# Patient Record
Sex: Male | Born: 1935 | Race: White | Hispanic: No | Marital: Married | Smoking: Former smoker
Health system: Southern US, Community
[De-identification: ages and names within clinical notes are randomized; demographics above are authoritative.]

## PROBLEM LIST (undated history)

## (undated) DIAGNOSIS — C439 Malignant melanoma of skin, unspecified: Secondary | ICD-10-CM

## (undated) DIAGNOSIS — E13311 Other specified diabetes mellitus with unspecified diabetic retinopathy with macular edema: Secondary | ICD-10-CM

## (undated) DIAGNOSIS — Z9289 Personal history of other medical treatment: Secondary | ICD-10-CM

## (undated) DIAGNOSIS — R42 Dizziness and giddiness: Secondary | ICD-10-CM

## (undated) DIAGNOSIS — I639 Cerebral infarction, unspecified: Secondary | ICD-10-CM

## (undated) DIAGNOSIS — C4491 Basal cell carcinoma of skin, unspecified: Secondary | ICD-10-CM

## (undated) DIAGNOSIS — N289 Disorder of kidney and ureter, unspecified: Secondary | ICD-10-CM

## (undated) DIAGNOSIS — R51 Headache: Secondary | ICD-10-CM

## (undated) DIAGNOSIS — L910 Hypertrophic scar: Secondary | ICD-10-CM

## (undated) DIAGNOSIS — E119 Type 2 diabetes mellitus without complications: Secondary | ICD-10-CM

## (undated) DIAGNOSIS — M1990 Unspecified osteoarthritis, unspecified site: Secondary | ICD-10-CM

## (undated) DIAGNOSIS — C44721 Squamous cell carcinoma of skin of unspecified lower limb, including hip: Secondary | ICD-10-CM

## (undated) DIAGNOSIS — R269 Unspecified abnormalities of gait and mobility: Secondary | ICD-10-CM

## (undated) DIAGNOSIS — N400 Enlarged prostate without lower urinary tract symptoms: Secondary | ICD-10-CM

## (undated) DIAGNOSIS — D136 Benign neoplasm of pancreas: Secondary | ICD-10-CM

## (undated) DIAGNOSIS — L989 Disorder of the skin and subcutaneous tissue, unspecified: Secondary | ICD-10-CM

## (undated) DIAGNOSIS — H9190 Unspecified hearing loss, unspecified ear: Secondary | ICD-10-CM

## (undated) DIAGNOSIS — Z9621 Cochlear implant status: Secondary | ICD-10-CM

## (undated) DIAGNOSIS — I2699 Other pulmonary embolism without acute cor pulmonale: Secondary | ICD-10-CM

## (undated) HISTORY — DX: Dizziness and giddiness: R42

## (undated) HISTORY — DX: Benign neoplasm of pancreas: D13.6

## (undated) HISTORY — PX: CATARACT EXTRACTION: SUR2

## (undated) HISTORY — DX: Benign prostatic hyperplasia without lower urinary tract symptoms: N40.0

## (undated) HISTORY — DX: Cerebral infarction, unspecified: I63.9

## (undated) HISTORY — DX: Unspecified hearing loss, unspecified ear: H91.90

## (undated) HISTORY — DX: Unspecified abnormalities of gait and mobility: R26.9

## (undated) HISTORY — DX: Basal cell carcinoma of skin, unspecified: C44.91

## (undated) HISTORY — PX: COCHLEAR IMPLANT: SUR684

## (undated) HISTORY — DX: Cochlear implant status: Z96.21

## (undated) HISTORY — DX: Disorder of kidney and ureter, unspecified: N28.9

## (undated) HISTORY — DX: Other specified diabetes mellitus with unspecified diabetic retinopathy with macular edema: E13.311

## (undated) HISTORY — DX: Malignant melanoma of skin, unspecified: C43.9

## (undated) HISTORY — DX: Headache: R51

## (undated) HISTORY — DX: Hypertrophic scar: L91.0

## (undated) HISTORY — PX: MELANOMA EXCISION: SHX5266

---

## 1999-08-28 DIAGNOSIS — I2699 Other pulmonary embolism without acute cor pulmonale: Secondary | ICD-10-CM

## 1999-08-28 DIAGNOSIS — Z9289 Personal history of other medical treatment: Secondary | ICD-10-CM

## 1999-08-28 HISTORY — DX: Other pulmonary embolism without acute cor pulmonale: I26.99

## 1999-08-28 HISTORY — DX: Personal history of other medical treatment: Z92.89

## 1999-08-28 HISTORY — PX: COLECTOMY: SHX59

## 1999-08-28 HISTORY — PX: ABDOMINAL SURGERY: SHX537

## 1999-08-28 HISTORY — PX: SPLENECTOMY, TOTAL: SHX788

## 1999-08-28 HISTORY — PX: PANCREATECTOMY: SHX1019

## 2001-09-19 ENCOUNTER — Encounter: Payer: Self-pay | Admitting: Otolaryngology

## 2001-09-19 ENCOUNTER — Encounter: Admission: RE | Admit: 2001-09-19 | Discharge: 2001-09-19 | Payer: Self-pay | Admitting: Otolaryngology

## 2001-10-14 ENCOUNTER — Encounter: Admission: RE | Admit: 2001-10-14 | Discharge: 2001-10-14 | Payer: Self-pay | Admitting: Otolaryngology

## 2001-10-14 ENCOUNTER — Encounter: Payer: Self-pay | Admitting: Otolaryngology

## 2001-10-15 ENCOUNTER — Ambulatory Visit (HOSPITAL_BASED_OUTPATIENT_CLINIC_OR_DEPARTMENT_OTHER): Admission: RE | Admit: 2001-10-15 | Discharge: 2001-10-16 | Payer: Self-pay | Admitting: Otolaryngology

## 2001-10-15 ENCOUNTER — Encounter (INDEPENDENT_AMBULATORY_CARE_PROVIDER_SITE_OTHER): Payer: Self-pay | Admitting: *Deleted

## 2010-10-10 DIAGNOSIS — H2510 Age-related nuclear cataract, unspecified eye: Secondary | ICD-10-CM | POA: Insufficient documentation

## 2011-06-08 ENCOUNTER — Ambulatory Visit (INDEPENDENT_AMBULATORY_CARE_PROVIDER_SITE_OTHER): Payer: Medicare Other | Admitting: Urology

## 2011-06-08 DIAGNOSIS — N3941 Urge incontinence: Secondary | ICD-10-CM

## 2011-06-08 DIAGNOSIS — R3129 Other microscopic hematuria: Secondary | ICD-10-CM

## 2011-06-08 DIAGNOSIS — N401 Enlarged prostate with lower urinary tract symptoms: Secondary | ICD-10-CM

## 2011-06-08 DIAGNOSIS — R972 Elevated prostate specific antigen [PSA]: Secondary | ICD-10-CM

## 2011-06-18 DIAGNOSIS — G459 Transient cerebral ischemic attack, unspecified: Secondary | ICD-10-CM

## 2011-10-29 DIAGNOSIS — Z9621 Cochlear implant status: Secondary | ICD-10-CM | POA: Insufficient documentation

## 2012-04-23 DIAGNOSIS — R269 Unspecified abnormalities of gait and mobility: Secondary | ICD-10-CM | POA: Insufficient documentation

## 2012-04-23 DIAGNOSIS — H532 Diplopia: Secondary | ICD-10-CM | POA: Insufficient documentation

## 2012-04-23 DIAGNOSIS — R42 Dizziness and giddiness: Secondary | ICD-10-CM | POA: Insufficient documentation

## 2012-04-23 DIAGNOSIS — I679 Cerebrovascular disease, unspecified: Secondary | ICD-10-CM | POA: Insufficient documentation

## 2012-05-02 ENCOUNTER — Other Ambulatory Visit: Payer: Self-pay | Admitting: Neurology

## 2012-05-02 DIAGNOSIS — R269 Unspecified abnormalities of gait and mobility: Secondary | ICD-10-CM

## 2012-05-02 DIAGNOSIS — R42 Dizziness and giddiness: Secondary | ICD-10-CM

## 2012-05-02 DIAGNOSIS — I679 Cerebrovascular disease, unspecified: Secondary | ICD-10-CM

## 2012-05-02 DIAGNOSIS — H532 Diplopia: Secondary | ICD-10-CM

## 2012-05-06 ENCOUNTER — Ambulatory Visit
Admission: RE | Admit: 2012-05-06 | Discharge: 2012-05-06 | Disposition: A | Discharge status: Home / Self Care | Payer: Medicare Other | Source: Ambulatory Visit | Attending: Neurology | Admitting: Neurology

## 2012-05-06 DIAGNOSIS — R42 Dizziness and giddiness: Secondary | ICD-10-CM

## 2012-05-06 DIAGNOSIS — I679 Cerebrovascular disease, unspecified: Secondary | ICD-10-CM

## 2012-05-06 DIAGNOSIS — H532 Diplopia: Secondary | ICD-10-CM

## 2012-05-06 DIAGNOSIS — R269 Unspecified abnormalities of gait and mobility: Secondary | ICD-10-CM

## 2012-06-20 ENCOUNTER — Ambulatory Visit (INDEPENDENT_AMBULATORY_CARE_PROVIDER_SITE_OTHER): Payer: Medicare Other | Admitting: Urology

## 2012-06-20 DIAGNOSIS — N401 Enlarged prostate with lower urinary tract symptoms: Secondary | ICD-10-CM

## 2012-06-20 DIAGNOSIS — R972 Elevated prostate specific antigen [PSA]: Secondary | ICD-10-CM

## 2012-06-20 DIAGNOSIS — R339 Retention of urine, unspecified: Secondary | ICD-10-CM

## 2012-11-28 ENCOUNTER — Ambulatory Visit (INDEPENDENT_AMBULATORY_CARE_PROVIDER_SITE_OTHER): Payer: Medicare Other | Admitting: Urology

## 2012-11-28 DIAGNOSIS — N401 Enlarged prostate with lower urinary tract symptoms: Secondary | ICD-10-CM

## 2012-11-28 DIAGNOSIS — R351 Nocturia: Secondary | ICD-10-CM

## 2012-11-28 DIAGNOSIS — R972 Elevated prostate specific antigen [PSA]: Secondary | ICD-10-CM

## 2012-12-15 ENCOUNTER — Encounter: Payer: Self-pay | Admitting: Neurology

## 2012-12-15 ENCOUNTER — Telehealth: Payer: Self-pay | Admitting: *Deleted

## 2012-12-15 NOTE — Telephone Encounter (Signed)
Message copied by Harlon Flor, Eldridge Marcott L on Mon Dec 15, 2012 11:04 AM ------      Message from: Blake George      Created: Mon Dec 15, 2012  9:23 AM       Pt needs appt with Dr. Lurline Hare in head x 2 weeks--has 2 implants in head--[phone](wife-Eloise) ------

## 2012-12-15 NOTE — Telephone Encounter (Signed)
Called patient and spoke with wife, stated that patient had follow up with Dr May(placed implants), he informed to follow up with Dr Anne Hahn, scheduled patient for appointment first available. Confirmed appt.with wife.

## 2012-12-18 ENCOUNTER — Telehealth: Payer: Self-pay | Admitting: *Deleted

## 2012-12-18 NOTE — Telephone Encounter (Signed)
I called and left a message for the patient to callback to the office to schedule an appt.

## 2012-12-18 NOTE — Telephone Encounter (Signed)
Message copied by Janey Greaser on Thu Dec 18, 2012  4:59 PM ------      Message from: Harlon Flor, AMMIE L      Created: Mon Dec 15, 2012 10:41 AM         Called patient and spoke with wife, stated that husband has seen Dr May on 12/02/12,  was told to follow up with Dr Anne Hahn, sched patient first available.      ----- Message -----         From: Esmond Harps         Sent: 12/15/2012   9:23 AM           To: [facility]            Pt needs appt with Dr. Lurline Hare in head x 2 weeks--has 2 implants in head--[phone](wife-Eloise)       ------

## 2012-12-19 ENCOUNTER — Telehealth: Payer: Self-pay | Admitting: *Deleted

## 2012-12-19 NOTE — Telephone Encounter (Signed)
I called patient. The patient began having headaches in the left posterior portion of the head about 3 weeks ago. The patient reports some neck discomfort as well. The headaches are worse at night when he lies down, much better during the day, but the headaches do not go away completely. These headaches may be cervicogenic in nature, but we will need to check a sedimentation rate as well. I'll try to get a work in revisit.

## 2012-12-19 NOTE — Telephone Encounter (Signed)
Message copied by Monico Blitz on Fri Dec 19, 2012  9:48 AM ------      Message from: Richrd Prime      Created: Fri Dec 19, 2012  8:57 AM      Contact: spouse       Patient's spouse called to tell us her husband has an appointment here in June, but really needs to come in asap.  He's having severe ha's from his cochlear implant.  Please return call to spouse @ [phone] ------

## 2012-12-19 NOTE — Telephone Encounter (Signed)
Called patient and spoke with wife and inform that Dr Anne Hahn has no openings on sched til Aug,and to keep his June appt. And he will be placed on cancellation list, she also wanted to inform Dr Anne Hahn of patient's head pain which started 3 weeks ago, went to see Dr May(coclear implant) and he said that he would contact Dr Anne Hahn about patient. She said that patient is having these head pains mostly at night and relieves them w/ advil-200mg ,one tablet to get back to sleep.

## 2012-12-26 ENCOUNTER — Encounter: Payer: Self-pay | Admitting: Neurology

## 2012-12-26 ENCOUNTER — Ambulatory Visit (INDEPENDENT_AMBULATORY_CARE_PROVIDER_SITE_OTHER): Payer: Medicare Other | Admitting: Neurology

## 2012-12-26 ENCOUNTER — Other Ambulatory Visit: Payer: Self-pay | Admitting: Neurology

## 2012-12-26 ENCOUNTER — Inpatient Hospital Stay
Admission: RE | Admit: 2012-12-26 | Discharge: 2012-12-26 | Disposition: A | Discharge status: Home / Self Care | Payer: Self-pay | Source: Ambulatory Visit | Attending: Neurology | Admitting: Neurology

## 2012-12-26 VITALS — BP 104/56 | HR 62 | Temp 97.0°F | Ht 70.0 in | Wt 185.0 lb

## 2012-12-26 DIAGNOSIS — R51 Headache: Secondary | ICD-10-CM

## 2012-12-26 DIAGNOSIS — R269 Unspecified abnormalities of gait and mobility: Secondary | ICD-10-CM

## 2012-12-26 DIAGNOSIS — R42 Dizziness and giddiness: Secondary | ICD-10-CM

## 2012-12-26 DIAGNOSIS — I679 Cerebrovascular disease, unspecified: Secondary | ICD-10-CM

## 2012-12-26 DIAGNOSIS — H532 Diplopia: Secondary | ICD-10-CM

## 2012-12-26 LAB — SEDIMENTATION RATE: Sed Rate: 4 mm/hr (ref 0–30)

## 2012-12-26 NOTE — Progress Notes (Signed)
Reason for visit: Headache  Blake George is an 79 y.o. male  History of present illness:  Blake George is an 79 year old left-handed white male with a history of decreased auditory acuity. The patient has had cochlear implants placed bilaterally. The patient has had a prior left mastoidectomy. The patient has problems with dizziness intermittently, and he is also had episodes of double vision that is vertical in nature. The patient was seen in August of 2013 for double vision. Workup was essentially negative. The patient had a CT scan of the head around that time that was unremarkable. The patient is allergic to IVP dye. The patient indicates that he has continued to have episodes of double vision which may last 2 weeks at a time, but he has only had one such episode within the last 3 months. This episode ended about one week ago. The patient began having headaches over the left mastoid area 6 weeks ago. The headaches have improved over time. The patient has had his external auditory canal cleaned of cerumen on several occasions. The patient denies any fevers or chills. The patient has not had any other symptoms of numbness or weakness of the face, arms, or legs. The patient denies any slurred speech or problems swallowing or chewing. The patient denies any alteration in balance, but he will stumble or fall on occasion. The patient has a chronic gait instability issue, and he uses a cane for ambulation. The patient is sent to this office for an evaluation.  Past Medical History  Diagnosis Date  . Dizziness     episodic  . Diabetes mellitus without complication   . H/O splenectomy   . Pancreatic adenoma     status post resection  . Colon disorder     partial resection  . Cochlear implant in place     bilaterally  . 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 Melanoma     left retinal  . Hypertrophy (benign) of prostate     benign prostatic  . Hypertrophic acne scar     benign  .  Renal insufficiency     chronic  . Cataract     left eye  . Hearing deficit   . Headache   . Vertigo   . Gait disturbance   . Stroke     auditory nerve,cerebellar artery stroke, left superior cerebellar artery    Past Surgical History  Procedure Laterality Date  . Splenectomy    . Cochlear implant Bilateral   . Cataract extraction Left   . Melanoma excision Left     Resected, left retina  . Colectomy      Partial  . Pancreatic adenoma resection      Family History  Problem Relation Age of Onset  . Heart attack Mother   . Diabetes Mother   . Stroke Father   . Heart disease Brother   . Diabetes Brother   . Cancer - Prostate Brother     Social history:  reports that he has quit smoking. He does not have any smokeless tobacco history on file. He reports that he does not drink alcohol or use illicit drugs.  Allergies:  Allergies  Allergen Reactions  . Diamode (Loperamide Hcl)     Reaction unknown  . Ivp Dye (Iodinated Diagnostic Agents)     Reaction unknown  . Morphine And Related     Reaction unknown  . Topamax (Topiramate)     Reaction unknown  . Plavix (Clopidogrel  Bisulfate)     Reaction unknown    Medications:  Current Outpatient Prescriptions on File Prior to Visit  Medication Sig Dispense Refill  . dipyridamole-aspirin (AGGRENOX) 200-25 MG per 12 hr capsule Take 1 capsule by mouth 2 (two) times daily.      Marland Kitchen dutasteride (AVODART) 0.5 MG capsule Take 0.5 mg by mouth daily.      . meclizine (ANTIVERT) 25 MG tablet Take 25 mg by mouth 3 (three) times daily as needed.      . Multiple Vitamins-Minerals (MULTIVITAMIN PO) Take by mouth daily.      . tamsulosin (FLOMAX) 0.4 MG CAPS Take by mouth daily. At night       No current facility-administered medications on file prior to visit.    ROS:  Out of a complete 14 system review of symptoms, the patient complains only of the following symptoms, and all other reviewed systems are negative.  Swelling in the  legs Decreased auditory acuity Easy bruising, easy bleeding Headache Double vision Gait instability  Blood pressure 104/56, pulse 62, temperature 97 F (36.1 C), temperature source Oral, height 5\' 10"  (1.778 m), weight 185 lb (83.915 kg).  Physical Exam  General: The patient is alert and cooperative at the time of the examination. The patient has cochlear implants in place.  Skin: 1-2+ edema at the ankles is noted bilaterally.   Neurologic Exam  Cranial nerves: Facial symmetry is present. Speech is normal, no aphasia or dysarthria is noted. Extraocular movements are full. Visual fields are full.  Motor: The patient has good strength in all 4 extremities.  Coordination: The patient has good finger-nose-finger and heel-to-shin bilaterally.  Gait and station: The patient has a slightly wide-based, unsteady gait. Tandem gait is unsteady. Romberg is negative. No drift is seen.  Reflexes: Deep tendon reflexes are symmetric.   Assessment/Plan:  1. Headache, new onset  2. Episodic double vision  3. Gait instability  The patient will be sent for blood work today for a sedimentation rate. The patient will undergo a repeat CT scan of the brain given the new onset headache. A carotid Doppler study will be done. The patient is to remain on Aggrenox. There is some question whether the cochlear implant on the left should be removed, whether or not this is a causal factor with the headache. The patient has a history of frequent ear infections on the left. The patient will followup in 3 months. The patient indicates that he does not wish to have any medications for the headache at this time.  Blake Palau MD 12/26/2012 11:44 AM  [facility] [address] [place], [place] [address]  Phone [phone] Fax [phone]

## 2013-01-02 ENCOUNTER — Ambulatory Visit
Admission: RE | Admit: 2013-01-02 | Discharge: 2013-01-02 | Disposition: A | Discharge status: Home / Self Care | Payer: Medicare Other | Source: Ambulatory Visit | Attending: Neurology | Admitting: Neurology

## 2013-01-02 ENCOUNTER — Telehealth: Payer: Self-pay | Admitting: Neurology

## 2013-01-02 DIAGNOSIS — R51 Headache: Secondary | ICD-10-CM

## 2013-01-02 NOTE — Telephone Encounter (Signed)
I called the patient and I talked with the wife. The CT scan looks stable from a prior study done in September 2013, normal for age. The carotid Doppler study is pending.

## 2013-01-06 ENCOUNTER — Ambulatory Visit (INDEPENDENT_AMBULATORY_CARE_PROVIDER_SITE_OTHER): Payer: Medicare Other

## 2013-01-06 DIAGNOSIS — H5310 Unspecified subjective visual disturbances: Secondary | ICD-10-CM

## 2013-01-06 DIAGNOSIS — R51 Headache: Secondary | ICD-10-CM

## 2013-01-12 ENCOUNTER — Telehealth: Payer: Self-pay | Admitting: Neurology

## 2013-01-12 NOTE — Telephone Encounter (Signed)
I called the patient and I spoke with the wife. The carotid doppler is OK.

## 2013-02-13 ENCOUNTER — Ambulatory Visit: Payer: Self-pay | Admitting: Neurology

## 2013-02-26 IMAGING — CT CT HEAD W/O CM
2 series · 16 of 30 positions shown, 20 images · non-contrast
Comparison: none

[Series 3: head bone · axial · 0.49mm/px · z∈[+28,+69]mm · 3 of 28 slices shown]
[im 2/28  bone]
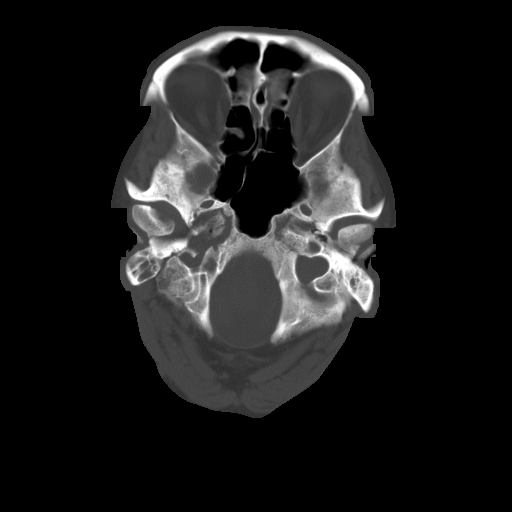
[im 6/28  bone]
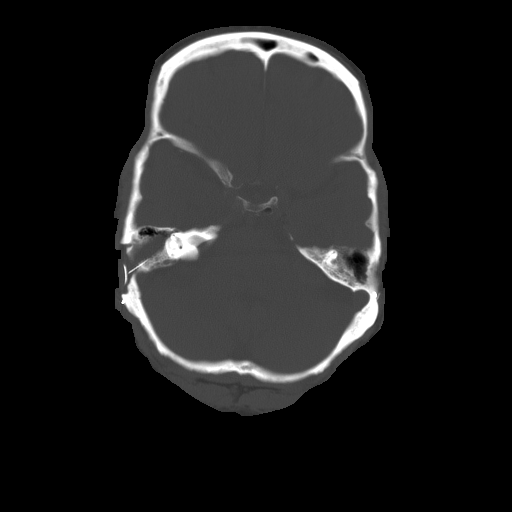
[im 10/28  bone]
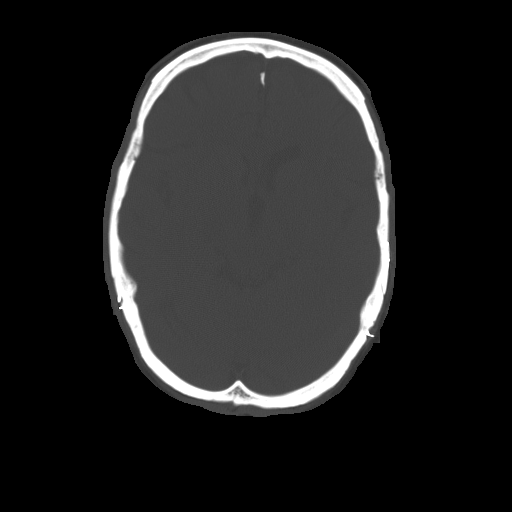

[Series 32: 3d filtered head w/o · axial · non-contrast · 0.49mm/px · z∈[+28,+152]mm · 13 of 28 slices shown, 17 images]
[im 2/28  brain]
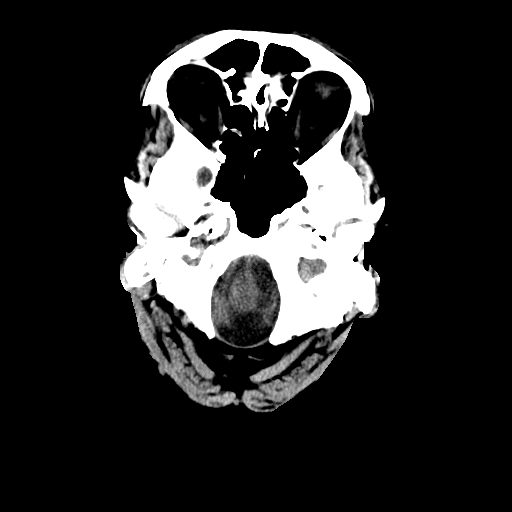
[im 2/28  bone]
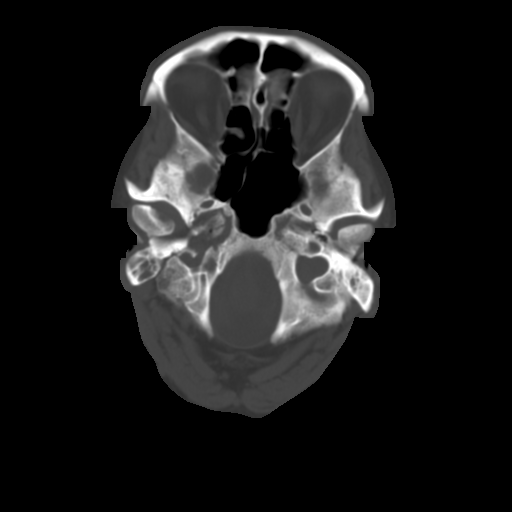
[im 4/28  brain]
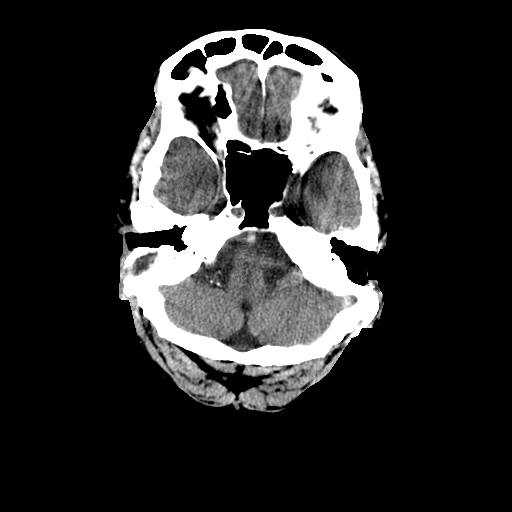
[im 6/28  brain]
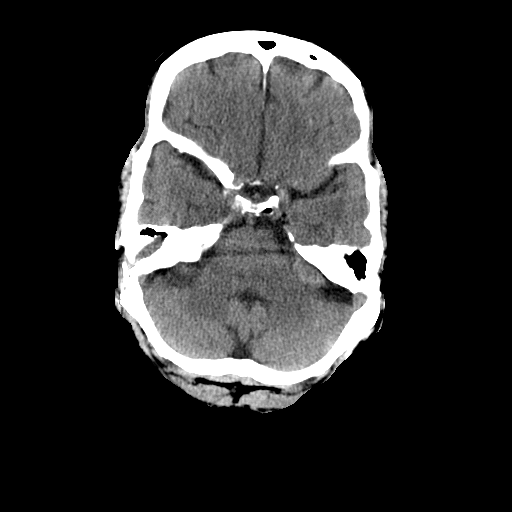
[im 8/28  brain]
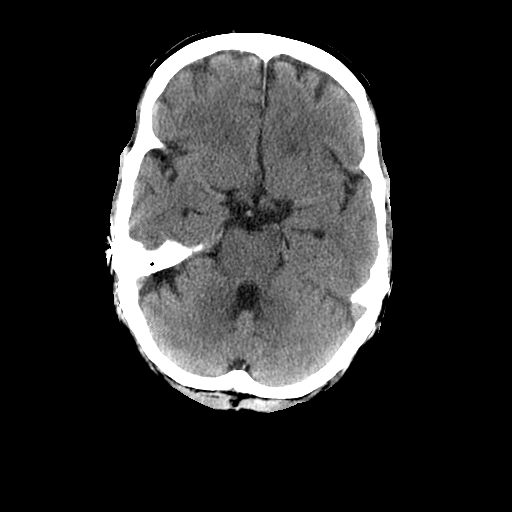
[im 10/28  brain]
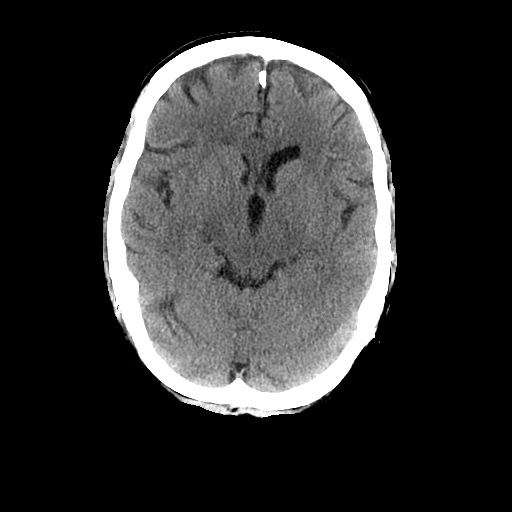
[im 10/28  bone]
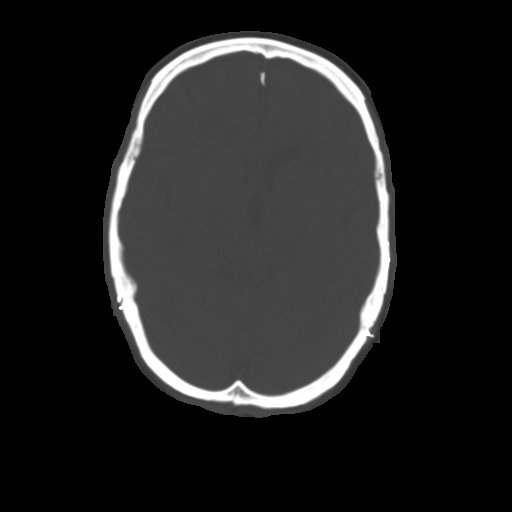
[im 12/28  brain]
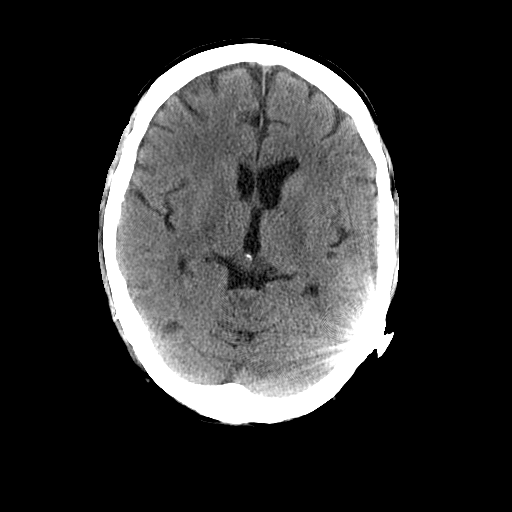
[im 14/28  brain]
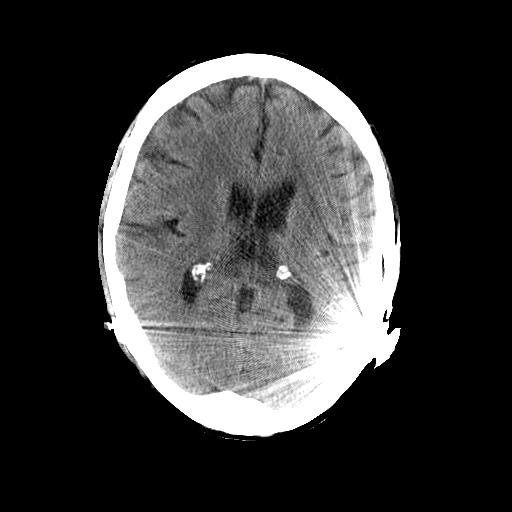
[im 16/28  brain]
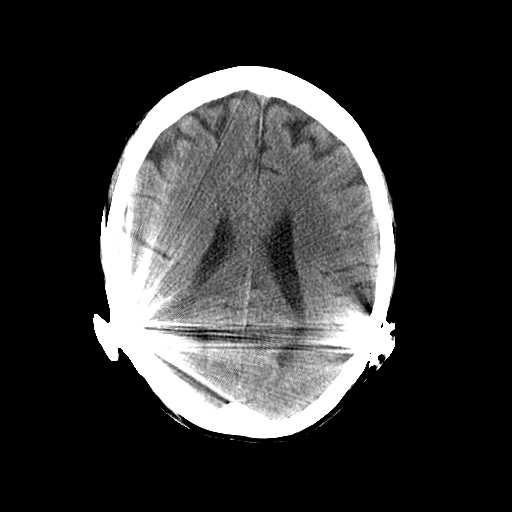
[im 18/28  brain]
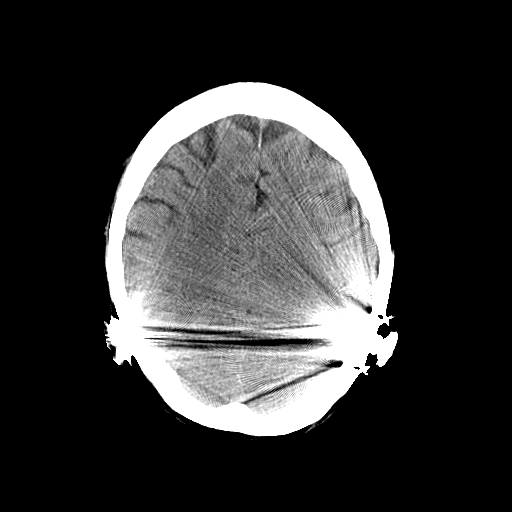
[im 18/28  bone]
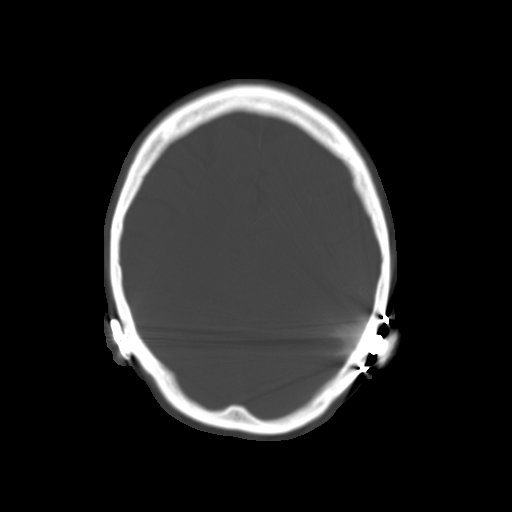
[im 20/28  brain]
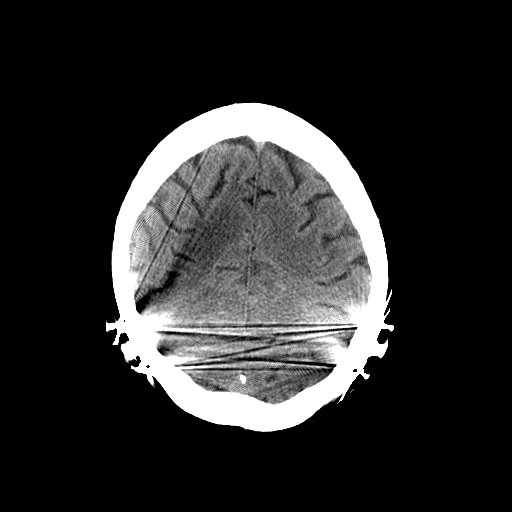
[im 22/28  brain]
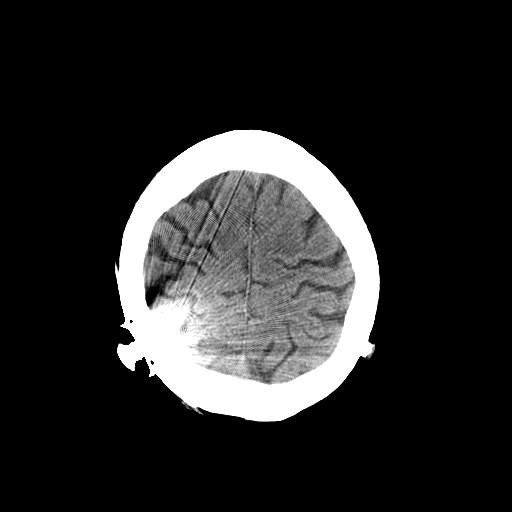
[im 24/28  brain]
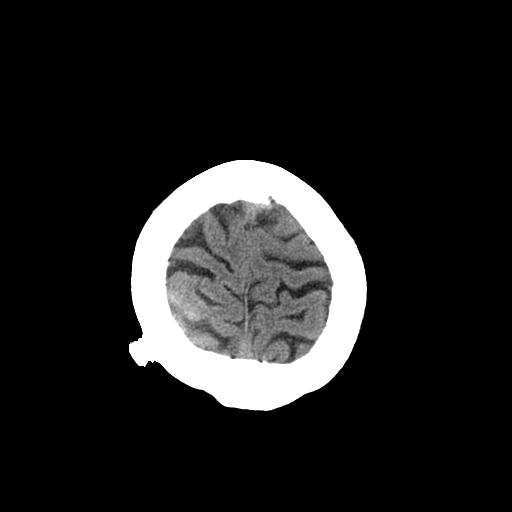
[im 26/28  brain]
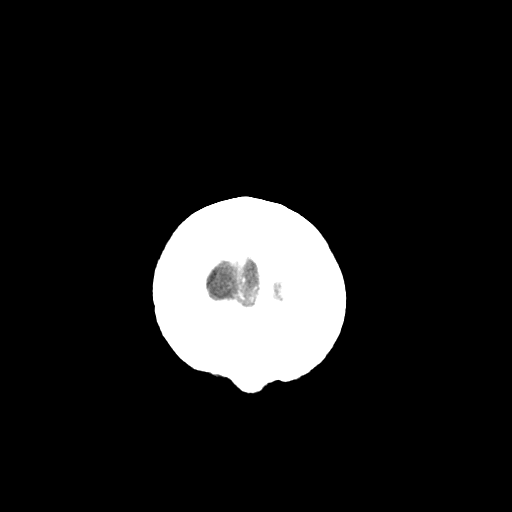
[im 26/28  bone]
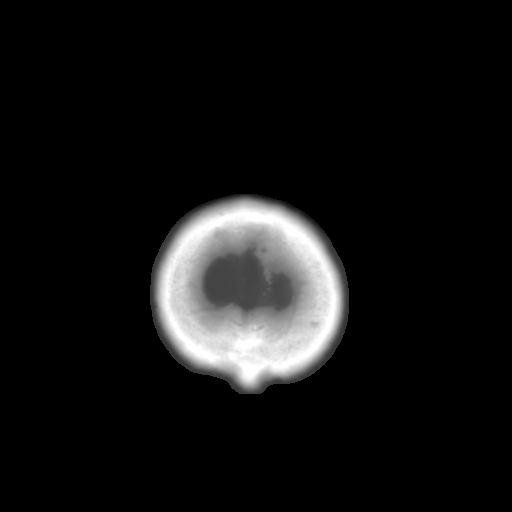

[16 of 30 positions shown; findings below may reference images not displayed]

This examination was performed at [HOSPITAL] at [HOSPITAL]
[HOSPITAL]. The interpretation will be provided by [REDACTED].

## 2013-05-25 ENCOUNTER — Encounter (INDEPENDENT_AMBULATORY_CARE_PROVIDER_SITE_OTHER): Payer: Self-pay | Admitting: Surgery

## 2013-05-27 ENCOUNTER — Ambulatory Visit (INDEPENDENT_AMBULATORY_CARE_PROVIDER_SITE_OTHER): Payer: Medicare Other | Admitting: Surgery

## 2013-05-27 ENCOUNTER — Encounter (INDEPENDENT_AMBULATORY_CARE_PROVIDER_SITE_OTHER): Payer: Self-pay | Admitting: Surgery

## 2013-05-27 VITALS — BP 126/70 | HR 77 | Temp 98.0°F | Resp 16 | Ht 71.0 in | Wt 184.0 lb

## 2013-05-27 DIAGNOSIS — L989 Disorder of the skin and subcutaneous tissue, unspecified: Secondary | ICD-10-CM

## 2013-05-27 NOTE — Progress Notes (Signed)
Patient ID: Blake George, male   DOB: 05/06/1928, 78 y.o.   MRN: [id number]  Chief Complaint  Patient presents with  . Other    skin lesions of leg    HPI Blake George is a 79 y.o. male.   HPI This gentleman is referred by Dr. Sherril Croon for evaluation of an abnormal skin lesion on his right lower leg. He has had an abnormal skin lesion on his leg for many years. It was biopsied in the past and found to be benign. Over the last several weeks, this area has become enlarged and satellite lesions have developed around the lesion. It causes some discomfort and has had intermittent erythema. He has chronic edema in both his lower extremities.  He has had a history of melanoma in the high in the past Past Medical History  Diagnosis Date  . Dizziness     episodic  . Diabetes mellitus without complication   . H/O splenectomy   . Pancreatic adenoma     status post resection  . Colon disorder     partial resection  . Cochlear implant in place     bilaterally  . 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 Melanoma     left retinal  . Hypertrophy (benign) of prostate     benign prostatic  . Hypertrophic acne scar     benign  . Renal insufficiency     chronic  . Cataract     left eye  . Hearing deficit   . Headache(784.0)   . Vertigo   . Gait disturbance   . Stroke     auditory nerve,cerebellar artery stroke, left superior cerebellar artery    Past Surgical History  Procedure Laterality Date  . Splenectomy    . Cochlear implant Bilateral   . Cataract extraction Left   . Melanoma excision Left     Resected, left retina  . Colectomy      Partial  . Pancreatic adenoma resection      Family History  Problem Relation Age of Onset  . Heart attack Mother   . Diabetes Mother   . Stroke Father   . Heart disease Brother   . Diabetes Brother   . Cancer - Prostate Brother     Social History History  Substance Use Topics  . Smoking status: Former Games developer  . Smokeless tobacco:  Not on file  . Alcohol Use: No    Allergies  Allergen Reactions  . Diamode [Loperamide Hcl]     Reaction unknown  . Ivp Dye [Iodinated Diagnostic Agents]     Reaction unknown  . Morphine And Related     Reaction unknown  . Topamax [Topiramate]     Reaction unknown  . Plavix [Clopidogrel Bisulfate]     Reaction unknown    Current Outpatient Prescriptions  Medication Sig Dispense Refill  . dipyridamole-aspirin (AGGRENOX) 200-25 MG per 12 hr capsule Take 1 capsule by mouth 2 (two) times daily.      Marland Kitchen dutasteride (AVODART) 0.5 MG capsule Take 0.5 mg by mouth daily.      . fluticasone (FLONASE) 50 MCG/ACT nasal spray       . furosemide (LASIX) 20 MG tablet       . meclizine (ANTIVERT) 25 MG tablet Take 25 mg by mouth 3 (three) times daily as needed.      . metFORMIN (GLUCOPHAGE) 500 MG tablet 500 mg daily.       . Multiple Vitamins-Minerals (MULTIVITAMIN PO)  Take by mouth daily.      . potassium chloride (K-DUR) 10 MEQ tablet       . rosuvastatin (CRESTOR) 5 MG tablet Take 2.5 mg by mouth daily.      . tamsulosin (FLOMAX) 0.4 MG CAPS Take by mouth daily. At night       No current facility-administered medications for this visit.    Review of Systems Review of Systems  Blood pressure 126/70, pulse 77, temperature 98 F (36.7 C), temperature source Temporal, resp. rate 16, height 5\' 11"  (1.803 m), weight 184 lb (83.462 kg).  Physical Exam Physical Exam  Constitutional: He appears well-developed and well-nourished. No distress.  HENT:  Head: Normocephalic and atraumatic.  Right Ear: External ear normal.  Left Ear: External ear normal.  Nose: Nose normal.  Eyes: Conjunctivae are normal. Pupils are equal, round, and reactive to light. Right eye exhibits no discharge. Left eye exhibits no discharge. No scleral icterus.  Neck: Normal range of motion. No tracheal deviation present.  Cardiovascular: Normal rate, regular rhythm, normal heart sounds and intact distal pulses.   No  murmur heard. Pulmonary/Chest: Effort normal and breath sounds normal. No respiratory distress. He has no wheezes.  Abdominal: Soft. Bowel sounds are normal. He exhibits no distension. There is no tenderness.  Musculoskeletal: Normal range of motion. He exhibits edema. He exhibits no tenderness.  Lymphadenopathy:    He has no cervical adenopathy.  Skin: Skin is warm and dry. He is not diaphoretic.  There is an abnormal-appearing skin lesion on the right lower leg below the knee and medially. It is several inches above the ankle. The largest areas 2 cm. There are multiple satellite lesions around this area for at least 6 more centimeters. It appears consistent with a possible basal or squamous cell carcinoma  Psychiatric: His behavior is normal. Judgment normal.    Data Reviewed   Assessment    Right lower extremity abnormal skin lesion     Plan    Again, this is highly suspicious for malignancy. Even if a small biopsy was benign her nondiagnostic, I believe this area needs to be removed for complete histological evaluation and to prevent further skin breakdown and spread. This would need a wide local excision of anywhere between 6 and 8 cm. I discussed this with the patient and his wife. I discussed the risks of surgery which includes but is not limited to bleeding, infection, wound breakdown, need for skin grafting, need for further surgery, et Karie Soda. He understands and wishes to proceed. Surgery will be scheduled        Melaysia Streed A 05/27/2013, 3:14 PM

## 2013-05-28 ENCOUNTER — Encounter (HOSPITAL_COMMUNITY): Payer: Self-pay | Admitting: Pharmacy Technician

## 2013-05-28 NOTE — Progress Notes (Signed)
Need orders in EPIC.  Surgery scheduled for 06/05/13.  Preop on 05/29/13 at 0900.  Thank You,.

## 2013-05-29 ENCOUNTER — Ambulatory Visit (HOSPITAL_COMMUNITY)
Admission: RE | Admit: 2013-05-29 | Discharge: 2013-05-29 | Disposition: A | Discharge status: Home / Self Care | Payer: Medicare Other | Source: Ambulatory Visit | Attending: Surgery | Admitting: Surgery

## 2013-05-29 ENCOUNTER — Encounter (HOSPITAL_COMMUNITY): Payer: Self-pay

## 2013-05-29 ENCOUNTER — Encounter (HOSPITAL_COMMUNITY)
Admission: RE | Admit: 2013-05-29 | Discharge: 2013-05-29 | Disposition: A | Discharge status: Home / Self Care | Payer: Medicare Other | Source: Ambulatory Visit | Attending: Surgery | Admitting: Surgery

## 2013-05-29 DIAGNOSIS — Z01812 Encounter for preprocedural laboratory examination: Secondary | ICD-10-CM | POA: Insufficient documentation

## 2013-05-29 DIAGNOSIS — L989 Disorder of the skin and subcutaneous tissue, unspecified: Secondary | ICD-10-CM | POA: Insufficient documentation

## 2013-05-29 DIAGNOSIS — Z01818 Encounter for other preprocedural examination: Secondary | ICD-10-CM | POA: Insufficient documentation

## 2013-05-29 DIAGNOSIS — Z01810 Encounter for preprocedural cardiovascular examination: Secondary | ICD-10-CM | POA: Insufficient documentation

## 2013-05-29 DIAGNOSIS — J90 Pleural effusion, not elsewhere classified: Secondary | ICD-10-CM | POA: Insufficient documentation

## 2013-05-29 HISTORY — DX: Disorder of the skin and subcutaneous tissue, unspecified: L98.9

## 2013-05-29 HISTORY — DX: Unspecified osteoarthritis, unspecified site: M19.90

## 2013-05-29 HISTORY — DX: Benign prostatic hyperplasia without lower urinary tract symptoms: N40.0

## 2013-05-29 LAB — BASIC METABOLIC PANEL
BUN: 22 mg/dL (ref 6–23)
CO2: 27 mEq/L (ref 19–32)
Chloride: 103 mEq/L (ref 96–112)
Creatinine, Ser: 1.05 mg/dL (ref 0.50–1.35)
GFR calc Af Amer: 74 mL/min — ABNORMAL LOW (ref >90)
Glucose, Bld: 100 mg/dL — ABNORMAL HIGH (ref 70–99)
Potassium: 4.2 mEq/L (ref 3.5–5.1)

## 2013-05-29 LAB — CBC
HCT: 38.6 % — ABNORMAL LOW (ref 39.0–52.0)
Hemoglobin: 13.4 g/dL (ref 13.0–17.0)
MCV: 98.5 fL (ref 78.0–100.0)
RBC: 3.92 MIL/uL — ABNORMAL LOW (ref 4.22–5.81)
WBC: 7.1 10*3/uL (ref 4.0–10.5)

## 2013-05-29 NOTE — Patient Instructions (Signed)
Blaise Schewe  05/29/2013                           YOUR PROCEDURE IS SCHEDULED ON:  06/05/13               PLEASE REPORT TO SHORT STAY CENTER AT :  7:00 am               CALL THIS NUMBER IF ANY PROBLEMS THE DAY OF SURGERY :               [phone]                      REMEMBER:   Do not eat food or drink liquids AFTER MIDNIGHT .  Take these medicines the morning of surgery with A SIP OF WATER:  avadart   Do not wear jewelry, make-up   Do not wear lotions, powders, or perfumes.   Do not shave legs or underarms 12 hrs. before surgery (men may shave face)  Do not bring valuables to the hospital.  Contacts, dentures or bridgework may not be worn into surgery.  Leave suitcase in the car. After surgery it may be brought to your room.  For patients admitted to the hospital more than one night, checkout time is 11:00                          The day of discharge.   Patients discharged the day of surgery will not be allowed to drive home                             If going home same day of surgery, must have someone stay with you first                           24 hrs at home and arrange for some one to drive you home from hospital.    Special Instructions:   Please read over the following fact sheets that you were given:                               1. [place] PREPARING FOR SURGERY SHEET                                                X_____________________________________________________________________        Failure to follow these instructions may result in cancellation of your surgery

## 2013-06-03 NOTE — Progress Notes (Signed)
Need orders please pt coming for surg 06/05/13 thank you

## 2013-06-04 ENCOUNTER — Ambulatory Visit (INDEPENDENT_AMBULATORY_CARE_PROVIDER_SITE_OTHER): Payer: Medicare Other | Admitting: Neurology

## 2013-06-04 ENCOUNTER — Other Ambulatory Visit (INDEPENDENT_AMBULATORY_CARE_PROVIDER_SITE_OTHER): Payer: Self-pay | Admitting: Surgery

## 2013-06-04 ENCOUNTER — Encounter: Payer: Self-pay | Admitting: Neurology

## 2013-06-04 VITALS — BP 125/63 | HR 58 | Wt 186.0 lb

## 2013-06-04 DIAGNOSIS — R42 Dizziness and giddiness: Secondary | ICD-10-CM

## 2013-06-04 DIAGNOSIS — H532 Diplopia: Secondary | ICD-10-CM

## 2013-06-04 DIAGNOSIS — R269 Unspecified abnormalities of gait and mobility: Secondary | ICD-10-CM

## 2013-06-04 DIAGNOSIS — R609 Edema, unspecified: Secondary | ICD-10-CM

## 2013-06-04 DIAGNOSIS — I679 Cerebrovascular disease, unspecified: Secondary | ICD-10-CM

## 2013-06-04 NOTE — Progress Notes (Signed)
Reason for visit: Dizziness  Blake George is an 79 y.o. male  History of present illness:  Blake George is an 79 year old left-handed white male with a history of problems with gait imbalance, dizziness, and intermittent double vision. When last seen, the patient had a cerebrovascular workup that included a CT scan of brain, and carotid Doppler study. The patient complained of headaches, and he subsequently underwent a sedimentation rate that was normal. The patient indicates that the headaches have gone away. The patient has not had any further episodes of double vision. The patient has had episodic double vision for number of years, and blood work evaluation has not shown evidence of B12 deficiencies, sarcoidosis, or myasthenia gravis. The patient is to undergo surgery tomorrow to remove a potentially cancerous skin lesion on his right lower leg felt to be a basal cell carcinoma. The patient will be undergoing general anesthesia, and he has come off of his Aggrenox 6 days ago. The patient walks with a cane, and he indicates that he has not had any recent falls. The patient is not operating motor vehicle. The patient does have peripheral edema, in part because he has to sleep with his head elevated secondary to the dizziness. The patient is trying to obtain an adjustable bed to help the dizziness issue and the peripheral edema.  Past Medical History  Diagnosis Date  . Dizziness     episodic  . Diabetes mellitus without complication   . H/O splenectomy   . Pancreatic adenoma     status post resection  . Colon disorder     partial resection  . Cochlear implant in place     bilaterally  . 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 Melanoma     left retinal  . Hypertrophy (benign) of prostate     benign prostatic  . Hypertrophic acne scar     benign  . Renal insufficiency     chronic  . Cataract     left eye  . Hearing deficit     deaf Left ear  . Headache(784.0)   . Vertigo      no problem past 2 yrs  . Gait disturbance   . Stroke     auditory nerve,cerebellar artery stroke, left superior cerebellar artery  . Arthritis   . History of melanoma     left eye  . Leg lesion   . Enlarged prostate   . Basal cell carcinoma     Right lower leg  . Hearing loss     Cochlear implant    Past Surgical History  Procedure Laterality Date  . Cochlear implant Bilateral 2006 / 2013  . Cataract extraction Left   . Melanoma excision Left     Resected, left retina  . Abdominal surgery  2001    Partial colectomy/partial pancreatectomy / spenectomy    Family History  Problem Relation Age of Onset  . Heart attack Mother   . Diabetes Mother   . Stroke Father   . Heart disease Brother   . Diabetes Brother   . Cancer - Prostate Brother     Social history:  reports that he quit smoking about 39 years ago. He has never used smokeless tobacco. He reports that he does not drink alcohol or use illicit drugs.    Allergies  Allergen Reactions  . Diamode [Loperamide Hcl]     Reaction unknown  . Ivp Dye [Iodinated Diagnostic Agents]     Reaction unknown  .  Morphine And Related     Reaction unknown  . Topamax [Topiramate]     Reaction unknown  . Plavix [Clopidogrel Bisulfate]     Reaction unknown    Medications:  Current Outpatient Prescriptions on File Prior to Visit  Medication Sig Dispense Refill  . dipyridamole-aspirin (AGGRENOX) 200-25 MG per 12 hr capsule Take 1 capsule by mouth 2 (two) times daily.      Marland Kitchen dutasteride (AVODART) 0.5 MG capsule Take 0.5 mg by mouth daily.      . metFORMIN (GLUCOPHAGE) 500 MG tablet Take 500 mg by mouth daily with breakfast.       . Multiple Vitamins-Minerals (MULTIVITAMIN PO) Take by mouth daily.      . potassium chloride (K-DUR) 10 MEQ tablet Take 10 mEq by mouth every morning.       . tamsulosin (FLOMAX) 0.4 MG CAPS Take 0.4 mg by mouth daily after supper.        No current facility-administered medications on file prior  to visit.    ROS:  Out of a complete 14 system review of symptoms, the patient complains only of the following symptoms, and all other reviewed systems are negative.  Swelling in the legs Dizziness, double vision Gait instability  Blood pressure 125/63, pulse 58, weight 186 lb (84.369 kg).  Physical Exam  General: The patient is alert and cooperative at the time of the examination.  Skin: 2+ edema at the ankles is noted bilaterally.   Neurologic Exam  Cranial nerves: Facial symmetry is present. Speech is normal, no aphasia or dysarthria is noted. Extraocular movements are full. Visual fields are full. The patient has decreased auditory acuity.  Motor: The patient has good strength in all 4 extremities.  Coordination: The patient has good finger-nose-finger and heel-to-shin bilaterally.  Gait and station: The patient has a wide-based, unsteady gait. The patient uses a cane for ambulation. Tandem gait was not attempted. Romberg is negative. No drift is seen.  Reflexes: Deep tendon reflexes are symmetric.   Assessment/Plan:  1. Chronic dizziness, gait instability  2. Episodic diplopia  3. Cochlear implant, decreased auditory acuity  The patient is at his usual baseline this point. There are no neurologic contraindications for his surgery. The patient will followup in 6 months. A prescription was written for a hospital bed, adjustable bed to help his dizziness while sleeping, and his peripheral edema. The patient requires elevation of the head and the feet at the same time.  Marlan Palau MD 06/04/2013 9:58 AM  [facility] [address] [place], [place] [address]  Phone [phone] Fax [phone]

## 2013-06-04 NOTE — H&P (Signed)
Patient ID: Blake George, male DOB: [birth year], 79 y.o. MRN: [id number]  Chief Complaint   Patient presents with   .  Other     skin lesions of leg   HPI  Blake George is a 79 y.o. male.  HPI  This gentleman is referred by Dr. Sherril Croon for evaluation of an abnormal skin lesion on his right lower leg. He has had an abnormal skin lesion on his leg for many years. It was biopsied in the past and found to be benign. Over the last several weeks, this area has become enlarged and satellite lesions have developed around the lesion. It causes some discomfort and has had intermittent erythema. He has chronic edema in both his lower extremities. He has had a history of melanoma in the high in the past  Past Medical History   Diagnosis  Date   .  Dizziness      episodic   .  Diabetes mellitus without complication    .  H/O splenectomy    .  Pancreatic adenoma      status post resection   .  Colon disorder      partial resection   .  Cochlear implant in place      bilaterally   .  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  Melanoma      left retinal   .  Hypertrophy (benign) of prostate      benign prostatic   .  Hypertrophic acne scar      benign   .  Renal insufficiency      chronic   .  Cataract      left eye   .  Hearing deficit    .  Headache(784.0)    .  Vertigo    .  Gait disturbance    .  Stroke      auditory nerve,cerebellar artery stroke, left superior cerebellar artery    Past Surgical History   Procedure  Laterality  Date   .  Splenectomy     .  Cochlear implant  Bilateral    .  Cataract extraction  Left    .  Melanoma excision  Left      Resected, left retina   .  Colectomy       Partial   .  Pancreatic adenoma resection      Family History   Problem  Relation  Age of Onset   .  Heart attack  Mother    .  Diabetes  Mother    .  Stroke  Father    .  Heart disease  Brother    .  Diabetes  Brother    .  Cancer - Prostate  Brother    Social History   History   Substance Use Topics   .  Smoking status:  Former Games developer   .  Smokeless tobacco:  Not on file   .  Alcohol Use:  No    Allergies   Allergen  Reactions   .  Diamode [Loperamide Hcl]      Reaction unknown   .  Ivp Dye [Iodinated Diagnostic Agents]      Reaction unknown   .  Morphine And Related      Reaction unknown   .  Topamax [Topiramate]      Reaction unknown   .  Plavix [Clopidogrel Bisulfate]      Reaction unknown    Current Outpatient  Prescriptions   Medication  Sig  Dispense  Refill   .  dipyridamole-aspirin (AGGRENOX) 200-25 MG per 12 hr capsule  Take 1 capsule by mouth 2 (two) times daily.     Marland Kitchen  dutasteride (AVODART) 0.5 MG capsule  Take 0.5 mg by mouth daily.     .  fluticasone (FLONASE) 50 MCG/ACT nasal spray      .  furosemide (LASIX) 20 MG tablet      .  meclizine (ANTIVERT) 25 MG tablet  Take 25 mg by mouth 3 (three) times daily as needed.     .  metFORMIN (GLUCOPHAGE) 500 MG tablet  500 mg daily.     .  Multiple Vitamins-Minerals (MULTIVITAMIN PO)  Take by mouth daily.     .  potassium chloride (K-DUR) 10 MEQ tablet      .  rosuvastatin (CRESTOR) 5 MG tablet  Take 2.5 mg by mouth daily.     .  tamsulosin (FLOMAX) 0.4 MG CAPS  Take by mouth daily. At night      No current facility-administered medications for this visit.   Review of Systems  Review of Systems  Blood pressure 126/70, pulse 77, temperature 98 F (36.7 C), temperature source Temporal, resp. rate 16, height 5\' 11"  (1.803 m), weight 184 lb (83.462 kg).  Physical Exam  Physical Exam  Constitutional: He appears well-developed and well-nourished. No distress.  HENT:  Head: Normocephalic and atraumatic.  Right Ear: External ear normal.  Left Ear: External ear normal.  Nose: Nose normal.  Eyes: Conjunctivae are normal. Pupils are equal, round, and reactive to light. Right eye exhibits no discharge. Left eye exhibits no discharge. No scleral icterus.  Neck: Normal range of motion. No  tracheal deviation present.  Cardiovascular: Normal rate, regular rhythm, normal heart sounds and intact distal pulses.  No murmur heard.  Pulmonary/Chest: Effort normal and breath sounds normal. No respiratory distress. He has no wheezes.  Abdominal: Soft. Bowel sounds are normal. He exhibits no distension. There is no tenderness.  Musculoskeletal: Normal range of motion. He exhibits edema. He exhibits no tenderness.  Lymphadenopathy:  He has no cervical adenopathy.  Skin: Skin is warm and dry. He is not diaphoretic.  There is an abnormal-appearing skin lesion on the right lower leg below the knee and medially. It is several inches above the ankle. The largest areas 2 cm. There are multiple satellite lesions around this area for at least 6 more centimeters. It appears consistent with a possible basal or squamous cell carcinoma  Psychiatric: His behavior is normal. Judgment normal.  Data Reviewed  Assessment  Right lower extremity abnormal skin lesion  Plan  Again, this is highly suspicious for malignancy. Even if a small biopsy was benign her nondiagnostic, I believe this area needs to be removed for complete histological evaluation and to prevent further skin breakdown and spread. This would need a wide local excision of anywhere between 6 and 8 cm. I discussed this with the patient and his wife. I discussed the risks of surgery which includes but is not limited to bleeding, infection, wound breakdown, need for skin grafting, need for further surgery, et Karie Soda. He understands and wishes to proceed. Surgery will be scheduled

## 2013-06-05 ENCOUNTER — Encounter (HOSPITAL_COMMUNITY)
Admission: RE | Disposition: A | Discharge status: Home / Self Care | Payer: Self-pay | Source: Ambulatory Visit | Attending: Surgery

## 2013-06-05 ENCOUNTER — Encounter (HOSPITAL_COMMUNITY): Payer: Self-pay | Admitting: *Deleted

## 2013-06-05 ENCOUNTER — Encounter (HOSPITAL_COMMUNITY): Payer: Medicare Other | Admitting: Anesthesiology

## 2013-06-05 ENCOUNTER — Ambulatory Visit (HOSPITAL_COMMUNITY): Payer: Medicare Other | Admitting: Anesthesiology

## 2013-06-05 ENCOUNTER — Ambulatory Visit (HOSPITAL_COMMUNITY)
Admission: RE | Admit: 2013-06-05 | Discharge: 2013-06-05 | Disposition: A | Discharge status: Home / Self Care | Payer: Medicare Other | Source: Ambulatory Visit | Attending: Surgery | Admitting: Surgery

## 2013-06-05 DIAGNOSIS — C44721 Squamous cell carcinoma of skin of unspecified lower limb, including hip: Secondary | ICD-10-CM

## 2013-06-05 DIAGNOSIS — N400 Enlarged prostate without lower urinary tract symptoms: Secondary | ICD-10-CM | POA: Insufficient documentation

## 2013-06-05 DIAGNOSIS — D0470 Carcinoma in situ of skin of unspecified lower limb, including hip: Secondary | ICD-10-CM | POA: Insufficient documentation

## 2013-06-05 DIAGNOSIS — E11319 Type 2 diabetes mellitus with unspecified diabetic retinopathy without macular edema: Secondary | ICD-10-CM | POA: Insufficient documentation

## 2013-06-05 DIAGNOSIS — Z8673 Personal history of transient ischemic attack (TIA), and cerebral infarction without residual deficits: Secondary | ICD-10-CM | POA: Insufficient documentation

## 2013-06-05 DIAGNOSIS — Z85820 Personal history of malignant melanoma of skin: Secondary | ICD-10-CM | POA: Insufficient documentation

## 2013-06-05 DIAGNOSIS — L905 Scar conditions and fibrosis of skin: Secondary | ICD-10-CM | POA: Insufficient documentation

## 2013-06-05 DIAGNOSIS — L821 Other seborrheic keratosis: Secondary | ICD-10-CM | POA: Insufficient documentation

## 2013-06-05 DIAGNOSIS — E1139 Type 2 diabetes mellitus with other diabetic ophthalmic complication: Secondary | ICD-10-CM | POA: Insufficient documentation

## 2013-06-05 DIAGNOSIS — E11311 Type 2 diabetes mellitus with unspecified diabetic retinopathy with macular edema: Secondary | ICD-10-CM | POA: Insufficient documentation

## 2013-06-05 HISTORY — PX: LESION REMOVAL: SHX5196

## 2013-06-05 LAB — GLUCOSE, CAPILLARY: Glucose-Capillary: 145 mg/dL — ABNORMAL HIGH (ref 70–99)

## 2013-06-05 SURGERY — EXCISION, LESION, LOWER EXTREMITY
Anesthesia: General | Site: Leg Lower | Laterality: Right | Wound class: Clean

## 2013-06-05 MED ORDER — TRAMADOL HCL 50 MG PO TABS: 50.0000 mg | ORAL_TABLET | Freq: Four times a day (QID) | ORAL | Status: DC | PRN

## 2013-06-05 MED ORDER — 0.9 % SODIUM CHLORIDE (POUR BTL) OPTIME
TOPICAL | Status: DC | PRN
  Administered 2013-06-05: 1000 mL

## 2013-06-05 MED ORDER — LACTATED RINGERS IV SOLN
INTRAVENOUS | Status: DC | PRN
  Administered 2013-06-05: 09:00:00 via INTRAVENOUS

## 2013-06-05 MED ORDER — CEFAZOLIN SODIUM-DEXTROSE 2-3 GM-% IV SOLR
2.0000 g | INTRAVENOUS | Status: AC
  Administered 2013-06-05: 2 g via INTRAVENOUS

## 2013-06-05 MED ORDER — FENTANYL CITRATE 0.05 MG/ML IJ SOLN
INTRAMUSCULAR | Status: DC | PRN
  Administered 2013-06-05: 50 ug via INTRAVENOUS

## 2013-06-05 MED ORDER — PROPOFOL 10 MG/ML IV BOLUS
INTRAVENOUS | Status: DC | PRN
  Administered 2013-06-05: 150 mg via INTRAVENOUS

## 2013-06-05 MED ORDER — LACTATED RINGERS IV SOLN: INTRAVENOUS | Status: DC

## 2013-06-05 MED ORDER — FENTANYL CITRATE 0.05 MG/ML IJ SOLN: 25.0000 ug | INTRAMUSCULAR | Status: DC | PRN

## 2013-06-05 MED ORDER — BUPIVACAINE-EPINEPHRINE 0.25% -1:200000 IJ SOLN
INTRAMUSCULAR | Status: AC
  Filled 2013-06-05: qty 1

## 2013-06-05 MED ORDER — LIDOCAINE HCL 1 % IJ SOLN
INTRAMUSCULAR | Status: DC | PRN
  Administered 2013-06-05: 60 mg via INTRADERMAL

## 2013-06-05 MED ORDER — ONDANSETRON HCL 4 MG/2ML IJ SOLN
INTRAMUSCULAR | Status: DC | PRN
  Administered 2013-06-05: 4 mg via INTRAMUSCULAR

## 2013-06-05 MED ORDER — CEFAZOLIN SODIUM-DEXTROSE 2-3 GM-% IV SOLR
INTRAVENOUS | Status: AC
  Filled 2013-06-05: qty 50

## 2013-06-05 SURGICAL SUPPLY — 43 items
BANDAGE ELASTIC 4 VELCRO ST LF (GAUZE/BANDAGES/DRESSINGS) ×2 IMPLANT
BANDAGE GAUZE ELAST BULKY 4 IN (GAUZE/BANDAGES/DRESSINGS) ×2 IMPLANT
BLADE HEX COATED 2.75 (ELECTRODE) ×2 IMPLANT
BLADE SURG SZ10 CARB STEEL (BLADE) ×4 IMPLANT
CANISTER SUCTION 2500CC (MISCELLANEOUS) ×2 IMPLANT
CLOTH BEACON ORANGE TIMEOUT ST (SAFETY) ×2 IMPLANT
DECANTER SPIKE VIAL GLASS SM (MISCELLANEOUS) ×2 IMPLANT
DERMABOND ADVANCED (GAUZE/BANDAGES/DRESSINGS)
DERMABOND ADVANCED .7 DNX12 (GAUZE/BANDAGES/DRESSINGS) IMPLANT
DRAPE LAPAROSCOPIC ABDOMINAL (DRAPES) IMPLANT
DRAPE LAPAROTOMY T 102X78X121 (DRAPES) IMPLANT
DRAPE LAPAROTOMY TRNSV 102X78 (DRAPE) ×2 IMPLANT
DRAPE LG THREE QUARTER DISP (DRAPES) IMPLANT
ELECT REM PT RETURN 9FT ADLT (ELECTROSURGICAL) ×2
ELECTRODE REM PT RTRN 9FT ADLT (ELECTROSURGICAL) ×1 IMPLANT
GAUZE XEROFORM 1X8 LF (GAUZE/BANDAGES/DRESSINGS) ×2 IMPLANT
GLOVE BIO SURGEON STRL SZ7 (GLOVE) ×2 IMPLANT
GLOVE BIOGEL PI IND STRL 7.0 (GLOVE) ×1 IMPLANT
GLOVE BIOGEL PI IND STRL 7.5 (GLOVE) ×1 IMPLANT
GLOVE BIOGEL PI INDICATOR 7.0 (GLOVE) ×1
GLOVE BIOGEL PI INDICATOR 7.5 (GLOVE) ×1
GLOVE SURG SIGNA 7.5 PF LTX (GLOVE) ×2 IMPLANT
GOWN PREVENTION PLUS LG XLONG (DISPOSABLE) IMPLANT
GOWN PREVENTION PLUS XLARGE (GOWN DISPOSABLE) IMPLANT
GOWN STRL REIN XL XLG (GOWN DISPOSABLE) ×4 IMPLANT
KIT BASIN OR (CUSTOM PROCEDURE TRAY) ×2 IMPLANT
MARKER SKIN DUAL TIP RULER LAB (MISCELLANEOUS) ×2 IMPLANT
NEEDLE HYPO 25X1 1.5 SAFETY (NEEDLE) IMPLANT
NS IRRIG 1000ML POUR BTL (IV SOLUTION) ×2 IMPLANT
PACK BASIC VI WITH GOWN DISP (CUSTOM PROCEDURE TRAY) ×2 IMPLANT
PENCIL BUTTON HOLSTER BLD 10FT (ELECTRODE) ×2 IMPLANT
SPONGE GAUZE 4X4 12PLY (GAUZE/BANDAGES/DRESSINGS) ×2 IMPLANT
SPONGE LAP 18X18 X RAY DECT (DISPOSABLE) ×2 IMPLANT
SPONGE LAP 4X18 X RAY DECT (DISPOSABLE) IMPLANT
STAPLER VISISTAT 35W (STAPLE) IMPLANT
SUT ETHILON 2 0 PS N (SUTURE) ×12 IMPLANT
SUT MNCRL AB 4-0 PS2 18 (SUTURE) IMPLANT
SUT VIC AB 2-0 SH 27 (SUTURE) ×2
SUT VIC AB 2-0 SH 27X BRD (SUTURE) ×2 IMPLANT
SUT VIC AB 3-0 SH 18 (SUTURE) IMPLANT
SYR CONTROL 10ML LL (SYRINGE) ×2 IMPLANT
TOWEL OR 17X26 10 PK STRL BLUE (TOWEL DISPOSABLE) ×2 IMPLANT
YANKAUER SUCT BULB TIP 10FT TU (MISCELLANEOUS) ×2 IMPLANT

## 2013-06-05 NOTE — Anesthesia Postprocedure Evaluation (Signed)
  Anesthesia Post-op Note  Patient: Blake George  Procedure(s) Performed: Procedure(s) (LRB): LESION REMOVAL RIGHT LOWER LEG  (Right)  Patient Location: PACU  Anesthesia Type: General  Level of Consciousness: awake and alert   Airway and Oxygen Therapy: Patient Spontanous Breathing  Post-op Pain: mild  Post-op Assessment: Post-op Vital signs reviewed, Patient's Cardiovascular Status Stable, Respiratory Function Stable, Patent Airway and No signs of Nausea or vomiting  Last Vitals:  Filed Vitals:   06/05/13 1058  BP: 152/64  Pulse: 71  Temp: 36.8 C  Resp: 18    Post-op Vital Signs: stable   Complications: No apparent anesthesia complications

## 2013-06-05 NOTE — Anesthesia Preprocedure Evaluation (Addendum)
Anesthesia Evaluation  Patient identified by MRN, date of birth, ID band Patient awake    Reviewed: Allergy & Precautions, H&P , NPO status , Patient's Chart, lab work & pertinent test results  Airway Mallampati: II TM Distance: >3 FB Neck ROM: full    Dental  (+) Chipped and Dental Advisory Given Left upper front broken off:   Pulmonary neg pulmonary ROS,  breath sounds clear to auscultation  Pulmonary exam normal       Cardiovascular Exercise Tolerance: Good negative cardio ROS  Rhythm:regular Rate:Normal     Neuro/Psych  Headaches, Deaf. Cochlear implant. Vertigo but none for 2 years CVA, No Residual Symptoms negative psych ROS   GI/Hepatic negative GI ROS, Neg liver ROS,   Endo/Other  diabetes, Well Controlled, Type 2, Oral Hypoglycemic Agents  Renal/GU negative Renal ROS  negative genitourinary   Musculoskeletal   Abdominal   Peds  Hematology negative hematology ROS (+)   Anesthesia Other Findings   Reproductive/Obstetrics negative OB ROS                           Anesthesia Physical Anesthesia Plan  ASA: II  Anesthesia Plan: General   Post-op Pain Management:    Induction: Intravenous  Airway Management Planned: LMA  Additional Equipment:   Intra-op Plan:   Post-operative Plan:   Informed Consent: I have reviewed the patients History and Physical, chart, labs and discussed the procedure including the risks, benefits and alternatives for the proposed anesthesia with the patient or authorized representative who has indicated his/her understanding and acceptance.   Dental Advisory Given  Plan Discussed with: CRNA and Surgeon  Anesthesia Plan Comments:         Anesthesia Quick Evaluation

## 2013-06-05 NOTE — Interval H&P Note (Signed)
History and Physical Interval Note: no change in H and P  06/05/2013 7:57 AM  Blake George  has presented today for surgery, with the diagnosis of skin lesion  The various methods of treatment have been discussed with the patient and family. After consideration of risks, benefits and other options for treatment, the patient has consented to  Procedure(s): LESION REMOVAL RIGHT LOWER LEG  (Right) as a surgical intervention .  The patient's history has been reviewed, patient examined, no change in status, stable for surgery.  I have reviewed the patient's chart and labs.  Questions were answered to the patient's satisfaction.     [name] A

## 2013-06-05 NOTE — Preoperative (Signed)
Beta Blockers   Reason not to administer Beta Blockers:Not Applicable 

## 2013-06-05 NOTE — Op Note (Signed)
LESION REMOVAL RIGHT LOWER LEG   Procedure Note  Blake George 06/05/2013   Pre-op Diagnosis: abnormal skin lesion right lower leg     Post-op Diagnosis: same  Procedure(s): 12 CM WIDE EXCISION ABNORMAL SKIN LESION RIGHT LOWER LEG  Surgeon(s): Shelly Rubenstein, MD  Anesthesia: General  Staff:  Circulator: Gerda Diss, RN; Loman Brooklyn, RN Scrub Person: Clarnce Flock, CST Endoscopic Tech: Rhys Martini, EOT  Estimated Blood Loss: Minimal               Specimens: sent to path          [facility]   Date: 06/05/2013  Time: 9:49 AM

## 2013-06-05 NOTE — Transfer of Care (Signed)
Immediate Anesthesia Transfer of Care Note  Patient: Blake George  Procedure(s) Performed: Procedure(s): LESION REMOVAL RIGHT LOWER LEG  (Right)  Patient Location: PACU  Anesthesia Type:General  Level of Consciousness: awake, alert  and oriented  Airway & Oxygen Therapy: Patient Spontanous Breathing and Patient connected to face mask oxygen  Post-op Assessment: Report given to PACU RN and Post -op Vital signs reviewed and stable  Post vital signs: Reviewed and stable  Complications: No apparent anesthesia complications

## 2013-06-06 NOTE — Op Note (Signed)
NAMEARVO, EALY NO.:  [id number]  MEDICAL RECORD NO.:  [id number]  LOCATION:  [facility]                         FACILITY:  [facility]  PHYSICIAN:  Abigail Miyamoto, M.D. DATE OF BIRTH:  [birth year]  DATE OF PROCEDURE:  06/05/2013 DATE OF DISCHARGE:  06/05/2013                              OPERATIVE REPORT   PREOPERATIVE DIAGNOSIS:  Abnormal skin lesion of the right lower extremity.  POSTOPERATIVE DIAGNOSIS:  Abnormal skin lesion of the right lower extremity.  PROCEDURE:  A 12-cm wide excision of abnormal skin lesion on the right lower leg.  SURGEON:  Abigail Miyamoto, M.D.  ANESTHESIA:  General and 0.25% Marcaine.  ESTIMATED BLOOD LOSS:  Minimal.  INDICATIONS:  This is an 79 year old gentleman, who has had a draining skin lesion on his right lower extremity on the medial aspect, several inches above the ankle.  Several satellite lesions have appeared around this that appears almost consistent with a squamous cell malignancy. Decision was then made to proceed with wide excision of this area.  PROCEDURE IN DETAIL:  The patient was brought to the operating room, identified as [employer].  He was placed supine on the operating room table and general anesthesia was induced.  I anesthetized the skin around the large lesion with Marcaine.  I then performed a 12-cm elliptical incision around the lesion as well the satellite lesions.  I took this down into the subcutaneous tissue with electrocautery.  I then removed all the skin and subcutaneous tissue down to the fascia with electrocautery.  I then had to create medial and lateral skin flaps quite significantly with electrocautery.  I then closed the wound with few interrupted 2-0 Vicryl sutures and then both interrupted and horizontal mattress 2-0 nylon sutures.  I was able to reapproximate the entire incision.  Once this was completed, I placed Xeroform, gauze, and an Ace wrap on the wound.  The leg was then  elevated.  The patient tolerated the procedure well.  All the counts were correct at the end of the procedure.  The patient was then extubated in the operating room and taken in stable condition to the recovery room.     Abigail Miyamoto, M.D.     DB/MEDQ  D:  06/05/2013  T:  06/06/2013  Job:  [id number]

## 2013-06-08 ENCOUNTER — Encounter (HOSPITAL_COMMUNITY): Payer: Self-pay | Admitting: Surgery

## 2013-06-10 ENCOUNTER — Inpatient Hospital Stay (HOSPITAL_COMMUNITY)
Admission: AD | Admit: 2013-06-10 | Discharge: 2013-06-12 | DRG: 863 | Disposition: A | Discharge status: Home / Self Care | Payer: Medicare Other | Source: Ambulatory Visit | Attending: Surgery | Admitting: Surgery

## 2013-06-10 ENCOUNTER — Ambulatory Visit (INDEPENDENT_AMBULATORY_CARE_PROVIDER_SITE_OTHER): Payer: Medicare Other | Admitting: Surgery

## 2013-06-10 ENCOUNTER — Encounter (INDEPENDENT_AMBULATORY_CARE_PROVIDER_SITE_OTHER): Payer: Self-pay | Admitting: Surgery

## 2013-06-10 ENCOUNTER — Encounter (HOSPITAL_COMMUNITY): Payer: Self-pay | Admitting: General Practice

## 2013-06-10 VITALS — BP 130/64 | HR 91 | Temp 97.7°F | Resp 16 | Ht 71.0 in | Wt 182.0 lb

## 2013-06-10 DIAGNOSIS — T8140XA Infection following a procedure, unspecified, initial encounter: Principal | ICD-10-CM | POA: Diagnosis present

## 2013-06-10 DIAGNOSIS — T8149XA Infection following a procedure, other surgical site, initial encounter: Secondary | ICD-10-CM | POA: Insufficient documentation

## 2013-06-10 DIAGNOSIS — C4490 Unspecified malignant neoplasm of skin, unspecified: Secondary | ICD-10-CM | POA: Diagnosis present

## 2013-06-10 DIAGNOSIS — Y849 Medical procedure, unspecified as the cause of abnormal reaction of the patient, or of later complication, without mention of misadventure at the time of the procedure: Secondary | ICD-10-CM | POA: Diagnosis present

## 2013-06-10 DIAGNOSIS — L02419 Cutaneous abscess of limb, unspecified: Secondary | ICD-10-CM | POA: Diagnosis present

## 2013-06-10 HISTORY — DX: Other pulmonary embolism without acute cor pulmonale: I26.99

## 2013-06-10 HISTORY — DX: Type 2 diabetes mellitus without complications: E11.9

## 2013-06-10 HISTORY — DX: Squamous cell carcinoma of skin of unspecified lower limb, including hip: C44.721

## 2013-06-10 HISTORY — DX: Personal history of other medical treatment: Z92.89

## 2013-06-10 LAB — GLUCOSE, CAPILLARY
Glucose-Capillary: 104 mg/dL — ABNORMAL HIGH (ref 70–99)
Glucose-Capillary: 123 mg/dL — ABNORMAL HIGH (ref 70–99)

## 2013-06-10 LAB — COMPREHENSIVE METABOLIC PANEL
AST: 15 U/L (ref 0–37)
Albumin: 3.3 g/dL — ABNORMAL LOW (ref 3.5–5.2)
BUN: 26 mg/dL — ABNORMAL HIGH (ref 6–23)
Calcium: 9.7 mg/dL (ref 8.4–10.5)
Creatinine, Ser: 1.24 mg/dL (ref 0.50–1.35)
GFR calc Af Amer: 60 mL/min — ABNORMAL LOW (ref >90)
Total Bilirubin: 0.2 mg/dL — ABNORMAL LOW (ref 0.3–1.2)
Total Protein: 7.2 g/dL (ref 6.0–8.3)

## 2013-06-10 LAB — CBC
HCT: 36.6 % — ABNORMAL LOW (ref 39.0–52.0)
MCH: 34.9 pg — ABNORMAL HIGH (ref 26.0–34.0)
MCHC: 35.5 g/dL (ref 30.0–36.0)
MCV: 98.4 fL (ref 78.0–100.0)
Platelets: 212 10*3/uL (ref 150–400)
RDW: 12.6 % (ref 11.5–15.5)
WBC: 8.3 10*3/uL (ref 4.0–10.5)

## 2013-06-10 LAB — HEMOGLOBIN A1C
Hgb A1c MFr Bld: 6.5 % — ABNORMAL HIGH (ref <5.7)
Mean Plasma Glucose: 140 mg/dL — ABNORMAL HIGH (ref <117)

## 2013-06-10 MED ORDER — PIPERACILLIN-TAZOBACTAM 3.375 G IVPB
3.3750 g | Freq: Three times a day (TID) | INTRAVENOUS | Status: DC
  Administered 2013-06-10 – 2013-06-12 (×5): 3.375 g via INTRAVENOUS
  Filled 2013-06-10 (×8): qty 50

## 2013-06-10 MED ORDER — INFLUENZA VAC SPLIT QUAD 0.5 ML IM SUSP
0.5000 mL | INTRAMUSCULAR | Status: AC
  Administered 2013-06-11: 0.5 mL via INTRAMUSCULAR
  Filled 2013-06-10: qty 0.5

## 2013-06-10 MED ORDER — FENTANYL CITRATE 0.05 MG/ML IJ SOLN: 25.0000 ug | INTRAMUSCULAR | Status: DC | PRN

## 2013-06-10 MED ORDER — INSULIN ASPART 100 UNIT/ML ~~LOC~~ SOLN
0.0000 [IU] | Freq: Three times a day (TID) | SUBCUTANEOUS | Status: DC
  Administered 2013-06-11 (×3): 1 [IU] via SUBCUTANEOUS

## 2013-06-10 MED ORDER — DUTASTERIDE 0.5 MG PO CAPS
0.5000 mg | ORAL_CAPSULE | Freq: Every day | ORAL | Status: DC
  Administered 2013-06-11: 0.5 mg via ORAL
  Filled 2013-06-10 (×3): qty 1

## 2013-06-10 MED ORDER — INSULIN ASPART 100 UNIT/ML ~~LOC~~ SOLN: 0.0000 [IU] | Freq: Every day | SUBCUTANEOUS | Status: DC

## 2013-06-10 MED ORDER — SODIUM CHLORIDE 0.9 % IV SOLN
INTRAVENOUS | Status: DC
  Administered 2013-06-10 – 2013-06-11 (×3): via INTRAVENOUS

## 2013-06-10 MED ORDER — TAMSULOSIN HCL 0.4 MG PO CAPS
0.4000 mg | ORAL_CAPSULE | Freq: Every day | ORAL | Status: DC
  Administered 2013-06-10 – 2013-06-11 (×2): 0.4 mg via ORAL
  Filled 2013-06-10 (×4): qty 1

## 2013-06-10 MED ORDER — ONDANSETRON HCL 4 MG/2ML IJ SOLN: 4.0000 mg | Freq: Four times a day (QID) | INTRAMUSCULAR | Status: DC | PRN

## 2013-06-10 NOTE — H&P (Signed)
Blake George is an 79 y.o. male.   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 This was a very large wide excision and was under a significant amount of tension closing. Since then he has been unable to elevate his leg Because of back discomfort in his inability to lay flat From a cardiopulmonary standpoint as well. He presented to the office with erythema. He reports minimal pain.  Past Medical History  Diagnosis Date  . Dizziness     episodic  . Diabetes mellitus without complication   . H/O splenectomy   . Pancreatic adenoma     status post resection  . Colon disorder     partial resection  . Cochlear implant in place     bilaterally  . 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 Melanoma     left retinal  . Hypertrophy (benign) of prostate     benign prostatic  . Hypertrophic acne scar     benign  . Renal insufficiency     chronic  . Cataract     left eye  . Hearing deficit     deaf Left ear  . Headache(784.0)   . Vertigo     no problem past 2 yrs  . Gait disturbance   . Stroke     auditory nerve,cerebellar artery stroke, left superior cerebellar artery  . Arthritis   . History of melanoma     left eye  . Leg lesion   . Enlarged prostate   . Basal cell carcinoma     Right lower leg  . Hearing loss     Cochlear implant    Past Surgical History  Procedure Laterality Date  . Cochlear implant Bilateral 2006 / 2013  . Cataract extraction Left   . Melanoma excision Left     Resected, left retina  . Abdominal surgery  2001    Partial colectomy/partial pancreatectomy / spenectomy  . Lesion removal Right 06/05/2013    Procedure: LESION REMOVAL RIGHT LOWER LEG ;  Surgeon: Shelly Rubenstein, MD;  Location: [facility];  Service: General;  Laterality: Right;    Family History  Problem Relation Age of Onset  . Heart attack Mother   . Diabetes  Mother   . Stroke Father   . Heart disease Brother   . Diabetes Brother   . Cancer - Prostate Brother    Social History:  reports that he quit smoking about 39 years ago. He has never used smokeless tobacco. He reports that he does not drink alcohol or use illicit drugs.  Allergies:  Allergies  Allergen Reactions  . Diamode [Loperamide Hcl]     Reaction unknown  . Ivp Dye [Iodinated Diagnostic Agents]     Reaction unknown  . Morphine And Related     Reaction unknown  . Topamax [Topiramate]     Reaction unknown  . Zetia [Ezetimibe]     "family does not remember"  . Plavix [Clopidogrel Bisulfate]     Reaction unknown    Medications Prior to Admission  Medication Sig Dispense Refill  . dipyridamole-aspirin (AGGRENOX) 200-25 MG per 12 hr capsule Take 1 capsule by mouth 2 (two) times daily.      Marland Kitchen dutasteride (AVODART) 0.5 MG capsule Take 0.5 mg by mouth every morning.       . metFORMIN (GLUCOPHAGE) 500 MG tablet Take 500 mg  by mouth daily with breakfast.       . Multiple Vitamins-Minerals (MULTIVITAMIN PO) Take 1 tablet by mouth daily.       . tamsulosin (FLOMAX) 0.4 MG CAPS Take 0.4 mg by mouth daily after supper.         No results found for this or any previous visit (from the past 48 hour(s)). No results found.  Review of Systems  All other systems reviewed and are negative.    Blood pressure 145/64, pulse 78, temperature 98 F (36.7 C), temperature source Oral, resp. rate 17, height 5\' 11"  (1.803 m), weight 181 lb (82.101 kg), SpO2 96.00%. Physical Exam  Constitutional: He is oriented to person, place, and time. He appears well-developed and well-nourished. No distress.  HENT:  Head: Normocephalic and atraumatic.  Eyes: Conjunctivae are normal. Pupils are equal, round, and reactive to light.  Neck: Normal range of motion. Neck supple. No tracheal deviation present.  Cardiovascular: Normal rate, regular rhythm and normal heart sounds.   Respiratory: Effort normal  and breath sounds normal. No respiratory distress.  GI: Bowel sounds are normal.  Musculoskeletal:  There is swelling of his right lower extremity from the knee to the ankle. The sutures are intact. There is moderate to severe erythema around his sutures.  Lymphadenopathy:    He has no cervical adenopathy.  Neurological: He is alert and oriented to person, place, and time.  Skin: Skin is warm and dry.  Psychiatric: His behavior is normal. Judgment normal.     Assessment/Plan Postop wound infection right lower extremity  It is difficult to tell if this is infection or just erythema because of the extensive undermining I had to do medially and laterally to achieve closure of the wound. Again, he has been unable to elevate his leg as well creating a lot of edema. Plan will be to admit him to the hospital, start IV antibiotics, and explore the wound of the right leg in the operating room tomorrow.  Jacobe Study A 06/10/2013, 4:34 PM

## 2013-06-10 NOTE — Progress Notes (Signed)
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  The wound was closed with multiple nylon mattress sutures. Over the last several days there has been worsening and spreading erythema and some minimal drainage through the incision. He comes in today for evaluation.  Filed Vitals:   06/10/13 1441  BP: 130/64  Pulse: 91  Temp: 97.7 F (36.5 C)  Resp: 16     His entire lower leg is erythematous especially around his anterior tibial incision. He has significant edema of his foot as well as to the leg proximal to the surgical site. He has minimal tenderness here but the patient seems fairly stoic. There is some drainage from the incision between some of the sutures.  Impression: Wound infection after wide excision of a skin cancer of the right leg.  Plan: I discussed this with Dr. Magnus Ivan. We will directly admit the patient to the hospital and start him on IV antibiotics. He will likely go to the operating room tomorrow for removal of the sutures with complete washout of the wound and possible placement of a VAC. He will likely need a skin graft over this area.  Direct admission to the hospital.  Wilmon Arms. Corliss Skains, MD, [facility]  General/ Trauma Surgery  06/10/2013 5:17 PM

## 2013-06-11 LAB — GLUCOSE, CAPILLARY
Glucose-Capillary: 133 mg/dL — ABNORMAL HIGH (ref 70–99)
Glucose-Capillary: 121 mg/dL — ABNORMAL HIGH (ref 70–99)
Glucose-Capillary: 198 mg/dL — ABNORMAL HIGH (ref 70–99)

## 2013-06-11 NOTE — Progress Notes (Signed)
  Subjective: Denies pain comfortable  Objective: Vital signs in last 24 hours: Temp:  [97.7 F (36.5 C)-98.4 F (36.9 C)] 98 F (36.7 C) (10/16 0611) Pulse Rate:  [75-91] 75 (10/16 0611) Resp:  [16-17] 17 (10/16 0611) BP: (129-145)/(64-73) 130/70 mmHg (10/16 0611) SpO2:  [94 %-97 %] 97 % (10/16 0611) Weight:  [181 lb (82.101 kg)-182 lb (82.555 kg)] 181 lb (82.101 kg) (10/15 1600) Last BM Date: 06/10/13  Intake/Output from previous day: 10/15 0701 - 10/16 0700 In: 803.8 [I.V.:703.8; IV Piggyback:100] Out: 700 [Urine:700] Intake/Output this shift:    Right leg swelling edema and erythema improved  Lab Results:   Recent Labs  06/10/13 1636  WBC 8.3  HGB 13.0  HCT 36.6*  PLT 212   BMET  Recent Labs  06/10/13 1636  NA 138  K 4.9  CL 102  CO2 26  GLUCOSE 124*  BUN 26*  CREATININE 1.24  CALCIUM 9.7   PT/INR No results found for this basename: LABPROT, INR,  in the last 72 hours ABG No results found for this basename: PHART, PCO2, PO2, HCO3,  in the last 72 hours  Studies/Results: No results found.  Anti-infectives: Anti-infectives   Start     Dose/Rate Route Frequency Ordered Stop   06/10/13 1600  piperacillin-tazobactam (ZOSYN) IVPB 3.375 g     3.375 g 12.5 mL/hr over 240 Minutes Intravenous 3 times per day 06/10/13 1555        Assessment/Plan: s/p * No surgery found *  Post op swelling, right leg incision  WBC normal.  The erythema may not be cellulitis, but tension on the skin.  Will continue elevation and hold on surgery today.  LOS: 1 day    Navya Timmons A 06/11/2013

## 2013-06-12 LAB — GLUCOSE, CAPILLARY: Glucose-Capillary: 131 mg/dL — ABNORMAL HIGH (ref 70–99)

## 2013-06-12 MED ORDER — DOXYCYCLINE HYCLATE 100 MG PO TABS: 100.0000 mg | ORAL_TABLET | Freq: Two times a day (BID) | ORAL | Status: DC

## 2013-06-12 NOTE — Discharge Summary (Signed)
Physician Discharge Summary  Patient ID: Blake George MRN: [id number] DOB/AGE: [birth year] 79 y.o.  Admit date: 06/10/2013 Discharge date: 06/12/2013  Admission Diagnoses:  Discharge Diagnoses:  Active Problems:   * No active hospital problems. * right leg post op leg swelling and cellulitis  Discharged Condition: good  Hospital Course: admitted s/p huge, wide excision of skin cancer of right lower leg.  Developed significant swelling and erythema post op.  Was not keeping leg elevated.  Improved significantly during hospitalization with leg elevation and antibiotics.  Discharged home  Consults: None  Significant Diagnostic Studies:   Treatments: elevation of leg and antibiotics  Discharge Exam: Blood pressure 141/73, pulse 77, temperature 98.4 F (36.9 C), temperature source Oral, resp. rate 18, height 5\' 11"  (1.803 m), weight 181 lb (82.101 kg), SpO2 95.00%. General appearance: alert, cooperative and no distress Incision/Wound:right lower ext wound clean with sutures in place, swelling resolved, erythema improved  Disposition: 01-Home or Self Care   Future Appointments Provider Department Dept Phone   06/22/2013 11:20 AM Shelly Rubenstein, MD [facility], [place] [phone]   12/07/2013 11:00 AM York Spaniel, MD [facility] [phone]       Medication List         AGGRENOX 200-25 MG per 12 hr capsule  Generic drug:  dipyridamole-aspirin  Take 1 capsule by mouth 2 (two) times daily.     AVODART 0.5 MG capsule  Generic drug:  dutasteride  Take 0.5 mg by mouth every morning.     doxycycline 100 MG tablet  Commonly known as:  VIBRA-TABS  Take 1 tablet (100 mg total) by mouth 2 (two) times daily.     FLOMAX 0.4 MG Caps capsule  Generic drug:  tamsulosin  Take 0.4 mg by mouth daily after supper.     metFORMIN 500 MG tablet  Commonly known as:  GLUCOPHAGE  Take 500 mg by mouth daily with breakfast.     MULTIVITAMIN PO  Take 1  tablet by mouth daily.           Follow-up Information   Follow up with [facility] A, MD. Call on 06/16/2013. (call and ask for [facility].  I need to see you Tuesday next week)    Specialty:  General Surgery   Contact information:   [address] [place] [address] [phone]       Signed: Shelly Rubenstein 06/12/2013, 8:02 AM

## 2013-06-12 NOTE — Progress Notes (Signed)
DC instructions gone over with patient and wife, questions answered, verbalized understanding.  Rx given for antibiotics, stressed the importance of taking the full course of antibiotics as prescribed.  Patient transported via wheelchair to A1 entrance to be taken home by wife.

## 2013-06-12 NOTE — Progress Notes (Signed)
  Subjective: No complaints comfortable  Objective: Vital signs in last 24 hours: Temp:  [98 F (36.7 C)-98.7 F (37.1 C)] 98.4 F (36.9 C) (10/17 0526) Pulse Rate:  [62-78] 77 (10/17 0526) Resp:  [17-18] 18 (10/16 2241) BP: (122-141)/(56-89) 141/73 mmHg (10/17 0526) SpO2:  [95 %-99 %] 95 % (10/17 0526) Last BM Date: 06/11/13  Intake/Output from previous day: 10/16 0701 - 10/17 0700 In: 2125 [P.O.:240; I.V.:1735; IV Piggyback:150] Out: 1400 [Urine:1400] Intake/Output this shift:    Right leg wound looks tremendously better Erythema much less given swelling almost totally resolved  Lab Results:   Recent Labs  06/10/13 1636  WBC 8.3  HGB 13.0  HCT 36.6*  PLT 212   BMET  Recent Labs  06/10/13 1636  NA 138  K 4.9  CL 102  CO2 26  GLUCOSE 124*  BUN 26*  CREATININE 1.24  CALCIUM 9.7   PT/INR No results found for this basename: LABPROT, INR,  in the last 72 hours ABG No results found for this basename: PHART, PCO2, PO2, HCO3,  in the last 72 hours  Studies/Results: No results found.  Anti-infectives: Anti-infectives   Start     Dose/Rate Route Frequency Ordered Stop   06/10/13 1600  piperacillin-tazobactam (ZOSYN) IVPB 3.375 g     3.375 g 12.5 mL/hr over 240 Minutes Intravenous 3 times per day 06/10/13 1555        Assessment/Plan: s/p * No surgery found *  Discharge home on oral antibiotics  LOS: 2 days    Cordera Stineman A 06/12/2013

## 2013-06-13 ENCOUNTER — Telehealth (INDEPENDENT_AMBULATORY_CARE_PROVIDER_SITE_OTHER): Payer: Self-pay | Admitting: Surgery

## 2013-06-13 ENCOUNTER — Other Ambulatory Visit (INDEPENDENT_AMBULATORY_CARE_PROVIDER_SITE_OTHER): Payer: Self-pay | Admitting: Surgery

## 2013-06-13 MED ORDER — SULFAMETHOXAZOLE-TRIMETHOPRIM 400-80 MG PO TABS: 1.0000 | ORAL_TABLET | Freq: Two times a day (BID) | ORAL | Status: AC

## 2013-06-13 NOTE — Telephone Encounter (Signed)
Wife of patient called.  States doxycycline caused severe diarrhea after one dose and patient refuses to take any more.  At their request, I discontinued doxycycline and prescribed Bactrim DS.  Velora Heckler, MD, [facility] Office: [phone]

## 2013-06-16 ENCOUNTER — Encounter (INDEPENDENT_AMBULATORY_CARE_PROVIDER_SITE_OTHER): Payer: Self-pay | Admitting: Surgery

## 2013-06-16 ENCOUNTER — Ambulatory Visit (INDEPENDENT_AMBULATORY_CARE_PROVIDER_SITE_OTHER): Payer: Medicare Other | Admitting: Surgery

## 2013-06-16 VITALS — BP 108/64 | HR 77 | Temp 98.1°F | Resp 16 | Ht 71.0 in | Wt 181.0 lb

## 2013-06-16 DIAGNOSIS — Z09 Encounter for follow-up examination after completed treatment for conditions other than malignant neoplasm: Secondary | ICD-10-CM

## 2013-06-16 NOTE — Progress Notes (Signed)
Subjective:     Patient ID: Blake George, male   DOB: 06-01-1928, 79 y.o.   MRN: [id number]  HPI He is here for another postoperative visit. He is doing much better now and is keeping his leg up as he was supposed to  Review of Systems     Objective:   Physical Exam On exam, there is minimal swelling and minimal erythema. I removed the sutures and placed Steri-Strips.  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

## 2013-06-22 ENCOUNTER — Ambulatory Visit (INDEPENDENT_AMBULATORY_CARE_PROVIDER_SITE_OTHER): Payer: Medicare Other | Admitting: Surgery

## 2013-06-22 ENCOUNTER — Encounter (INDEPENDENT_AMBULATORY_CARE_PROVIDER_SITE_OTHER): Payer: Self-pay | Admitting: Surgery

## 2013-06-22 VITALS — BP 120/73 | HR 77 | Temp 97.4°F | Resp 16 | Ht 71.0 in | Wt 181.4 lb

## 2013-06-22 DIAGNOSIS — Z09 Encounter for follow-up examination after completed treatment for conditions other than malignant neoplasm: Secondary | ICD-10-CM

## 2013-06-22 NOTE — Progress Notes (Signed)
Subjective:     Patient ID: Blake George, male   DOB: 08-03-1928, 79 y.o.   MRN: [id number]  HPI He is here for another postoperative visit. He denies any fevers. There is only mild discomfort. He is still having some wound drainage  Review of Systems     Objective:   Physical Exam On exam, there is still some small amount of breakdown which is approximately 2.5 cm x 0.5 cm. There is excellent granulation tissue    Assessment:     Patient stable postop     Plan:     He will now start daily wet to dry dressing changes on the small open area. He may increase his activity. I will see him back in 2 weeks

## 2013-07-02 ENCOUNTER — Encounter: Payer: Self-pay | Admitting: Neurology

## 2013-07-07 ENCOUNTER — Ambulatory Visit (INDEPENDENT_AMBULATORY_CARE_PROVIDER_SITE_OTHER): Payer: Medicare Other | Admitting: Surgery

## 2013-07-07 ENCOUNTER — Encounter (INDEPENDENT_AMBULATORY_CARE_PROVIDER_SITE_OTHER): Payer: Self-pay | Admitting: Surgery

## 2013-07-07 VITALS — BP 110/68 | HR 68 | Temp 98.6°F | Resp 14 | Ht 71.0 in | Wt 183.2 lb

## 2013-07-07 DIAGNOSIS — Z09 Encounter for follow-up examination after completed treatment for conditions other than malignant neoplasm: Secondary | ICD-10-CM

## 2013-07-07 NOTE — Progress Notes (Signed)
Subjective:     Patient ID: Blake George, male   DOB: [birth year], 79 y.o.   MRN: [id number]  HPI He is here for another wound check. He has developed a rash around the leg. The wound itself is clean  Review of Systems     Objective:   Physical Exam On exam, there is an open wound which is clean. There is a papular rash around his leg    Assessment:     Patient stable postop     Plan:     I believe the rashes secondary to dry skin from where his leg had been so edematous before. He will start keeping lotion on the rash. He will continue his dressing changes and I will see him back next week

## 2013-07-13 ENCOUNTER — Ambulatory Visit (INDEPENDENT_AMBULATORY_CARE_PROVIDER_SITE_OTHER): Payer: Medicare Other | Admitting: Surgery

## 2013-07-13 ENCOUNTER — Other Ambulatory Visit (INDEPENDENT_AMBULATORY_CARE_PROVIDER_SITE_OTHER): Payer: Self-pay | Admitting: Surgery

## 2013-07-13 ENCOUNTER — Encounter (INDEPENDENT_AMBULATORY_CARE_PROVIDER_SITE_OTHER): Payer: Self-pay | Admitting: Surgery

## 2013-07-13 VITALS — BP 112/70 | HR 68 | Temp 98.0°F | Resp 15 | Ht 71.0 in | Wt 183.8 lb

## 2013-07-13 DIAGNOSIS — Z09 Encounter for follow-up examination after completed treatment for conditions other than malignant neoplasm: Secondary | ICD-10-CM

## 2013-07-13 DIAGNOSIS — R21 Rash and other nonspecific skin eruption: Secondary | ICD-10-CM

## 2013-07-13 NOTE — Progress Notes (Signed)
Subjective:     Patient ID: Blake George, male   DOB: December 03, 1927, 79 y.o.   MRN: [id number]  HPI He is here for another postoperative visit status post wide excision of a squamous cell carcinoma of the right lower Extremity. He still has a small wound the leg which continues to improve. When I saw him last week he was actually developing a rash on his right lower extremity. We have been treating this with standard lotion. He is here today reporting that the rash Has gotten more extensive on the leg and he now has rash on his upper body  Review of Systems     Objective:   Physical Exam On exam, his lower extremity edema is much better. The wound is clean. He has extensive worsening of the rash all leg which is outside of the area of the biopsy. He also has a maculopapular rash on his back and     Assessment:     Patient with postop wound and rash     Plan:     He will start Benadryl lotion to the rash as well as oral Benadryl for his itching. We will give him an appointment with a dermatologist  as soon as possible.  I will see him back in 2 -3 weeks.

## 2013-07-29 ENCOUNTER — Encounter (INDEPENDENT_AMBULATORY_CARE_PROVIDER_SITE_OTHER): Payer: Self-pay | Admitting: Surgery

## 2013-07-29 ENCOUNTER — Ambulatory Visit (INDEPENDENT_AMBULATORY_CARE_PROVIDER_SITE_OTHER): Payer: Medicare Other | Admitting: Surgery

## 2013-07-29 VITALS — BP 118/60 | HR 68 | Temp 98.3°F | Resp 14 | Ht 71.0 in | Wt 183.6 lb

## 2013-07-29 DIAGNOSIS — Z09 Encounter for follow-up examination after completed treatment for conditions other than malignant neoplasm: Secondary | ICD-10-CM

## 2013-07-29 NOTE — Progress Notes (Signed)
Subjective:     Patient ID: Blake George, male   DOB: Dec 06, 1927, 79 y.o.   MRN: [id number]  HPI He is here for another postop visit. Since seeing a dermatologist, the rash on the right lower leg has almost completely resolved. He has no complaints.  Review of Systems     Objective:   Physical Exam On the incision, there were only 2 very small areas that remained open and again the rash has resolved    Assessment:     Patient stable postop     Plan:     I will see him back in one month for wound check. He will continue his current care

## 2013-09-01 ENCOUNTER — Encounter (INDEPENDENT_AMBULATORY_CARE_PROVIDER_SITE_OTHER): Payer: Self-pay | Admitting: Surgery

## 2013-09-01 ENCOUNTER — Ambulatory Visit (INDEPENDENT_AMBULATORY_CARE_PROVIDER_SITE_OTHER): Payer: Medicare Other | Admitting: Surgery

## 2013-09-01 VITALS — BP 123/70 | HR 77 | Temp 97.7°F | Resp 16 | Ht 71.0 in | Wt 186.2 lb

## 2013-09-01 DIAGNOSIS — T814XXD Infection following a procedure, subsequent encounter: Principal | ICD-10-CM

## 2013-09-01 DIAGNOSIS — IMO0001 Reserved for inherently not codable concepts without codable children: Secondary | ICD-10-CM

## 2013-09-01 DIAGNOSIS — Z5189 Encounter for other specified aftercare: Secondary | ICD-10-CM

## 2013-09-01 NOTE — Progress Notes (Signed)
Subjective:     Patient ID: Blake George, male   DOB: [birth year], 80 y.o.   MRN: [id number]  HPI He is here for another visit. She is doing well and has no complaints.  Review of Systems     Objective:   Physical Exam On exam, the wound is now completely healed. There are only changes consistent with chronic venous stasis    Assessment:     Healed right lower leg wound     Plan:     I will see him back as needed

## 2013-12-02 ENCOUNTER — Ambulatory Visit (INDEPENDENT_AMBULATORY_CARE_PROVIDER_SITE_OTHER): Payer: Medicare Other | Admitting: Neurology

## 2013-12-02 ENCOUNTER — Encounter (INDEPENDENT_AMBULATORY_CARE_PROVIDER_SITE_OTHER): Payer: Self-pay

## 2013-12-02 ENCOUNTER — Encounter: Payer: Self-pay | Admitting: Neurology

## 2013-12-02 VITALS — BP 101/52 | HR 69 | Wt 185.0 lb

## 2013-12-02 DIAGNOSIS — R269 Unspecified abnormalities of gait and mobility: Secondary | ICD-10-CM

## 2013-12-02 DIAGNOSIS — I679 Cerebrovascular disease, unspecified: Secondary | ICD-10-CM

## 2013-12-02 NOTE — Patient Instructions (Signed)

## 2013-12-02 NOTE — Progress Notes (Signed)
Reason for visit: Gait instability  Blake George is an 80 y.o. male  History of present illness:  Blake George is an 80 year old left-handed white male with a history of a cochlear implant, vestibular dysfunction, and cerebrovascular disease. The patient has had intermittent episodes of double vision, and a chronic gait disturbance. More recently, the patient has not had much in the way issues with the double vision, and he denies any dizziness issues. The patient still has chronic gait instability, and he uses a cane for ambulation. The patient has not had any recent falls. The patient uses good safety techniques to prevent falls. The patient recently had surgery on his right lower leg for a basal cell skin cancer resection. The patient otherwise has been doing fairly well.  Past Medical History  Diagnosis Date  . Dizziness     episodic  . Pancreatic adenoma     status post resection  . Cochlear implant in place     bilaterally  . Hypertrophy (benign) of prostate     benign prostatic  . Hypertrophic acne scar     benign  . Renal insufficiency     chronic  . Hearing deficit     deaf Left ear  . Headache(784.0)   . Vertigo     no problem past 2 yrs  . Gait disturbance   . Leg lesion   . Enlarged prostate   . Hearing loss     Cochlear implant  . Melanoma     left retinal; "took radiation for it ~ 2 yr ago" (06/10/2013)  . Basal cell carcinoma     Right lower leg  . Squamous cell carcinoma, leg     RLE/notes 06/10/2013  . Pulmonary embolism 2001    "after big stomach OR; in hospital for 78 days" (06/10/2013)  . Type II diabetes mellitus   . 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 History of blood transfusion 2001    "probably, related to abdominal OR" (06/10/2013)  . Stroke ~ 2004; ~ 2012    auditory nerve,cerebellar artery stroke, left superior cerebellar artery; "that's when I lost my hearing" (06/10/2013)  . Arthritis     "lower back" (06/10/2013)     Past Surgical History  Procedure Laterality Date  . Cochlear implant Bilateral 2006 / 2013  . Cataract extraction Left   . Melanoma excision Left     Resected, left retina  . Abdominal surgery  2001    Partial colectomy/partial pancreatectomy / spenectomy  . Lesion removal Right 06/05/2013    Procedure: LESION REMOVAL RIGHT LOWER LEG ;  Surgeon: Harl Bowie, MD;  Location: [facility];  Service: General;  Laterality: Right;  . Splenectomy, total  2001  . Colectomy  2001    partial  . Pancreatectomy  2001    partial    Family History  Problem Relation Age of Onset  . Heart attack Mother   . Diabetes Mother   . Stroke Father   . Heart disease Brother   . Diabetes Brother   . Cancer - Prostate Brother     Social history:  reports that he quit smoking about 39 years ago. His smoking use included Cigarettes. He has a 2.4 pack-year smoking history. He has never used smokeless tobacco. He reports that he does not drink alcohol or use illicit drugs.    Allergies  Allergen Reactions  . Diamode [Loperamide Hcl]     Reaction unknown  . Ivp  Dye [Iodinated Diagnostic Agents]     Reaction unknown  . Morphine And Related     Reaction unknown  . Topamax [Topiramate]     Reaction unknown  . Zetia [Ezetimibe]     "family does not remember"  . Clobetasol Propionate Rash  . Plavix [Clopidogrel Bisulfate]     Reaction unknown    Medications:  Current Outpatient Prescriptions on File Prior to Visit  Medication Sig Dispense Refill  . dipyridamole-aspirin (AGGRENOX) 200-25 MG per 12 hr capsule Take 1 capsule by mouth 2 (two) times daily.      Marland Kitchen dutasteride (AVODART) 0.5 MG capsule Take 0.5 mg by mouth every morning.       . metFORMIN (GLUCOPHAGE) 500 MG tablet Take 500 mg by mouth daily with breakfast.       . Multiple Vitamins-Minerals (MULTIVITAMIN PO) Take 1 tablet by mouth daily.       . tamsulosin (FLOMAX) 0.4 MG CAPS Take 0.4 mg by mouth daily after supper.        No  current facility-administered medications on file prior to visit.    ROS:  Out of a complete 14 system review of symptoms, the patient complains only of the following symptoms, and all other reviewed systems are negative.  Frequency of urination Gait disturbance Frequent waking Bruising easily  Blood pressure 101/52, pulse 69, weight 185 lb (83.915 kg).  Physical Exam  General: The patient is alert and cooperative at the time of the examination.  Skin: 1+ edema of ankles is noted bilaterally.   Neurologic Exam  Mental status: The patient is oriented x 3.  Cranial nerves: Facial symmetry is present. Speech is normal, no aphasia or dysarthria is noted. Extraocular movements are full. Visual fields are full. The patient is hard of hearing.  Motor: The patient has good strength in all 4 extremities.  Sensory examination: Soft touch sensation is symmetric on the face, arms, and legs.  Coordination: The patient has good finger-nose-finger and heel-to-shin bilaterally.  Gait and station: The patient has a wide-based gait, the patient uses a cane for ambulation. Tandem gait is unsteady.  Romberg is negative. No drift is seen.  Reflexes: Deep tendon reflexes are symmetric.   Assessment/Plan:  One. Vestibular dysfunction, cochlear implant  2. Intermittent double vision  3. Gait instability  The patient overall is stable, and he is doing well. The patient is able to cope with his balance issues, and maintain good safety. The patient will followup in one year, no alteration in therapies at this time. The patient does have a hospital bed at home which has helped his peripheral edema.  Jill Alexanders MD 12/02/2013 9:12 PM  Blake George [address] Blake George Blake [place] [address]  Phone [phone] Fax [phone]

## 2013-12-07 ENCOUNTER — Ambulatory Visit: Payer: Medicare Other | Admitting: Neurology

## 2014-01-08 ENCOUNTER — Ambulatory Visit (INDEPENDENT_AMBULATORY_CARE_PROVIDER_SITE_OTHER): Payer: Medicare Other | Admitting: Urology

## 2014-01-08 DIAGNOSIS — N3941 Urge incontinence: Secondary | ICD-10-CM

## 2014-01-08 DIAGNOSIS — N401 Enlarged prostate with lower urinary tract symptoms: Secondary | ICD-10-CM

## 2014-01-08 DIAGNOSIS — R351 Nocturia: Secondary | ICD-10-CM

## 2014-01-08 DIAGNOSIS — N138 Other obstructive and reflux uropathy: Secondary | ICD-10-CM

## 2014-03-21 IMAGING — CR DG CHEST 2V
2 series · 2 of 2 positions shown · non-contrast
Comparison: None.

CLINICAL DATA: Preop evaluation. Leg lesion

EXAM:
CHEST  2 VIEW

[w chest pa]
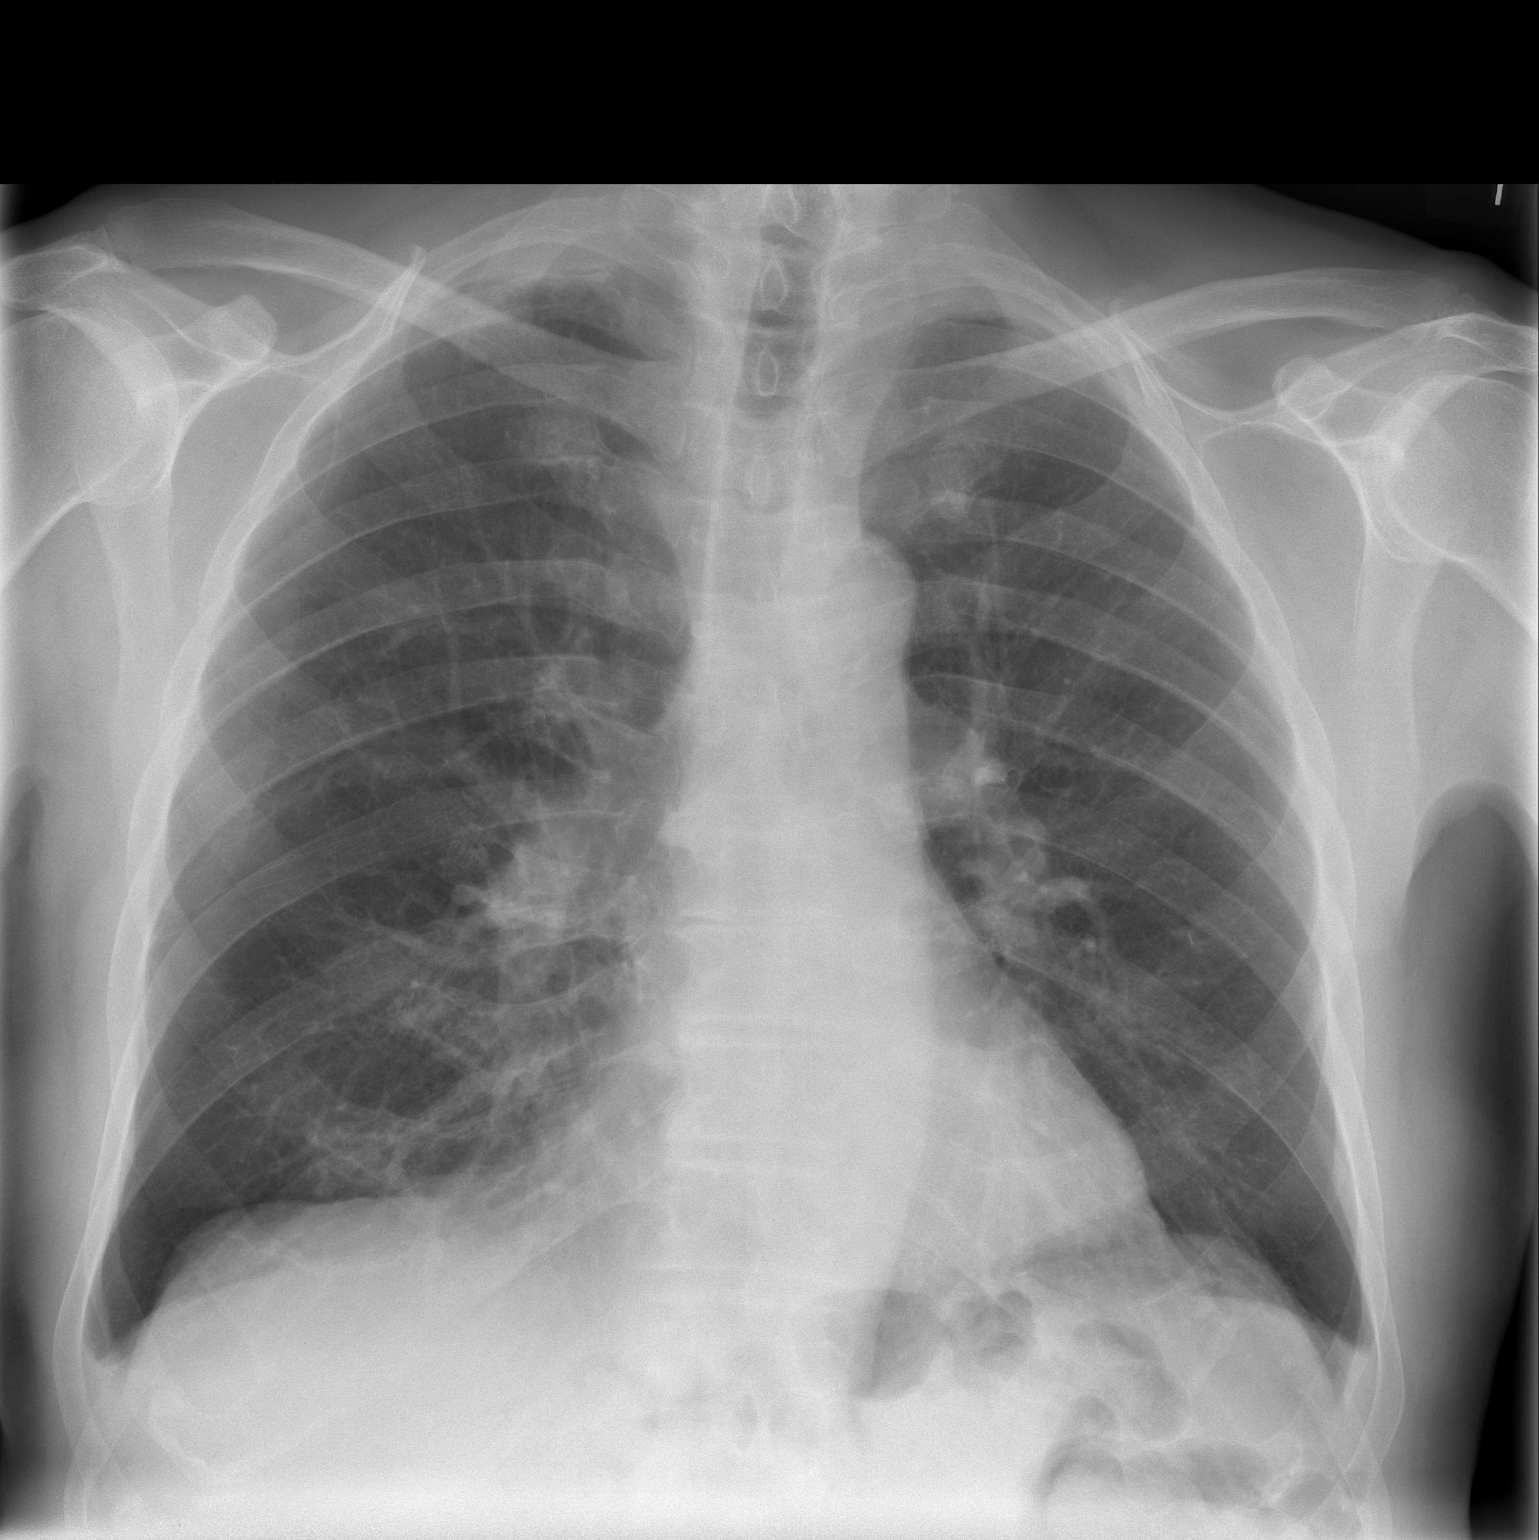

[w chest lat]
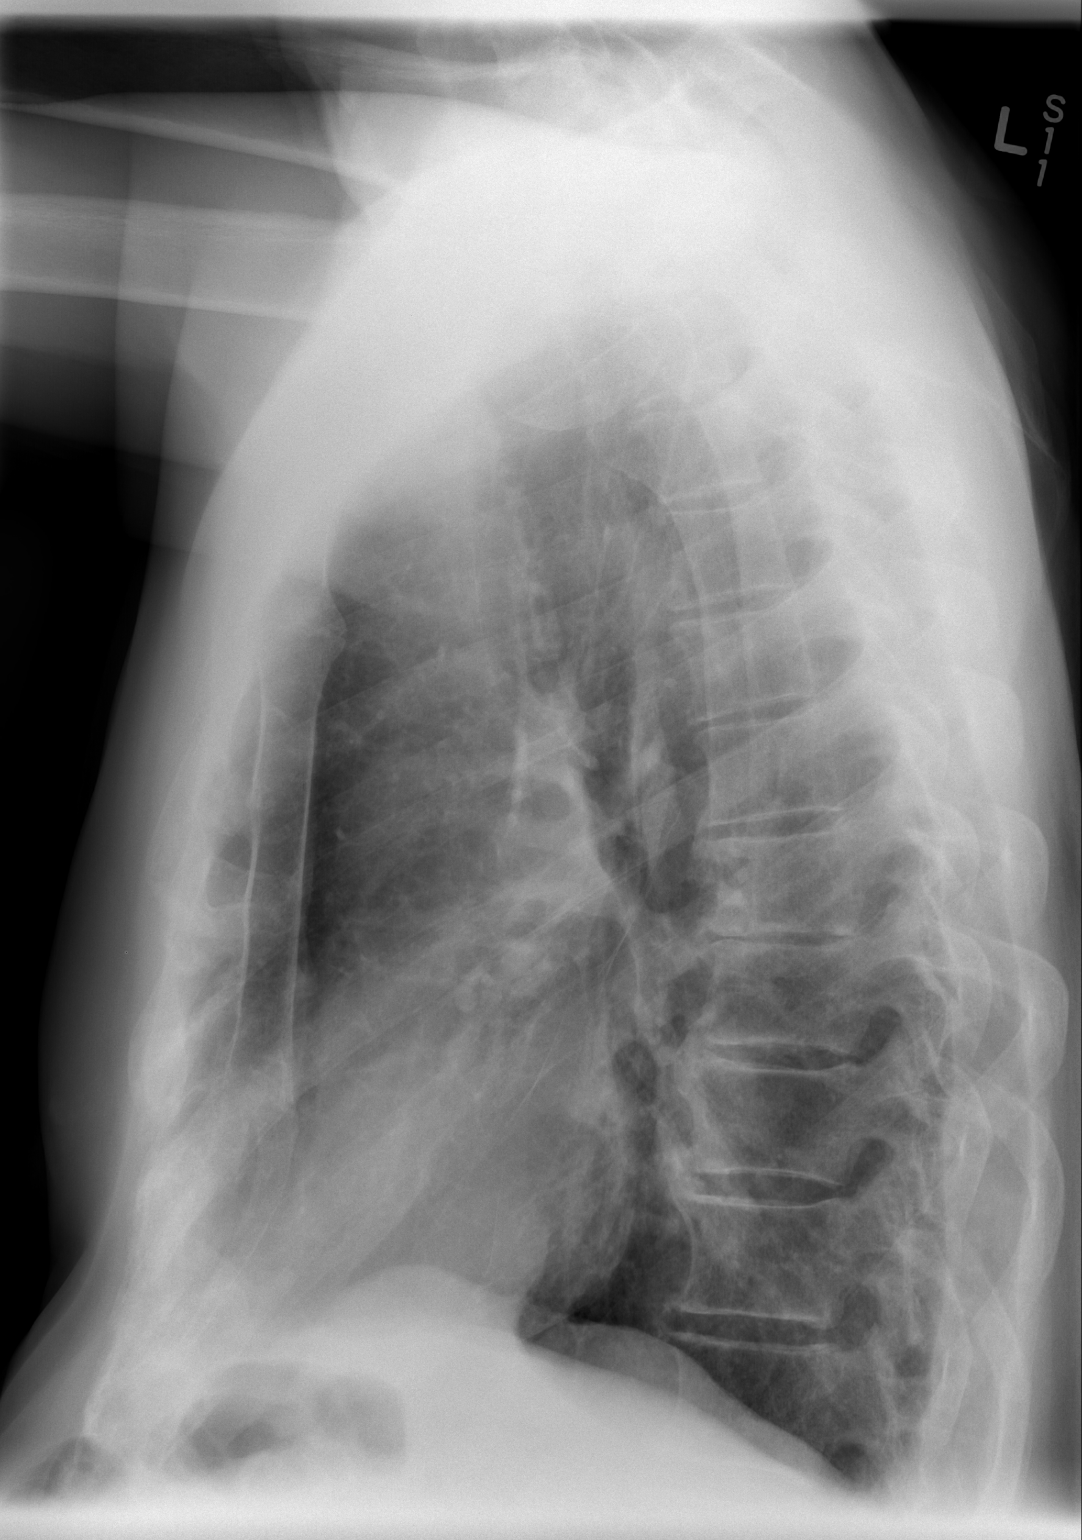

[2 of 2 positions shown; findings below may reference images not displayed]

FINDINGS: Pectus excavatum deformity of the sternum. Heart size is normal.
Vascularity is normal. Mild blunting in the costophrenic angle
bilaterally possibly due to small effusions. Apical pleural scarring
bilaterally.

Negative for pneumonia or mass.
IMPRESSION: Small bilateral pleural effusions. Negative for infiltrate or edema.

## 2014-07-14 ENCOUNTER — Encounter: Payer: Self-pay | Admitting: Neurology

## 2014-07-20 ENCOUNTER — Encounter: Payer: Self-pay | Admitting: Neurology

## 2014-10-29 ENCOUNTER — Telehealth: Payer: Self-pay | Admitting: Neurology

## 2014-10-29 MED ORDER — ASPIRIN-DIPYRIDAMOLE ER 25-200 MG PO CP12: 1.0000 | ORAL_CAPSULE | Freq: Two times a day (BID) | ORAL | Status: AC

## 2014-10-29 NOTE — Telephone Encounter (Signed)
I received phone call for med refill, aggrenox bid x60tabs, one refill, f/u in April 8th 2016

## 2014-12-03 ENCOUNTER — Ambulatory Visit: Payer: Medicare Other | Admitting: Neurology

## 2015-02-11 ENCOUNTER — Ambulatory Visit (INDEPENDENT_AMBULATORY_CARE_PROVIDER_SITE_OTHER): Payer: Medicare Other | Admitting: Urology

## 2015-02-11 DIAGNOSIS — N401 Enlarged prostate with lower urinary tract symptoms: Secondary | ICD-10-CM

## 2015-02-11 DIAGNOSIS — R972 Elevated prostate specific antigen [PSA]: Secondary | ICD-10-CM

## 2015-02-11 DIAGNOSIS — N3941 Urge incontinence: Secondary | ICD-10-CM | POA: Diagnosis not present

## 2015-02-11 DIAGNOSIS — R351 Nocturia: Secondary | ICD-10-CM

## 2015-05-21 ENCOUNTER — Ambulatory Visit (INDEPENDENT_AMBULATORY_CARE_PROVIDER_SITE_OTHER): Payer: Medicare Other

## 2015-05-21 DIAGNOSIS — Z23 Encounter for immunization: Secondary | ICD-10-CM

## 2015-09-14 DIAGNOSIS — Z6826 Body mass index (BMI) 26.0-26.9, adult: Secondary | ICD-10-CM | POA: Diagnosis not present

## 2015-09-14 DIAGNOSIS — E1122 Type 2 diabetes mellitus with diabetic chronic kidney disease: Secondary | ICD-10-CM | POA: Diagnosis not present

## 2015-09-14 DIAGNOSIS — G459 Transient cerebral ischemic attack, unspecified: Secondary | ICD-10-CM | POA: Diagnosis not present

## 2015-09-14 DIAGNOSIS — N401 Enlarged prostate with lower urinary tract symptoms: Secondary | ICD-10-CM | POA: Diagnosis not present

## 2015-09-14 DIAGNOSIS — M7989 Other specified soft tissue disorders: Secondary | ICD-10-CM | POA: Diagnosis not present

## 2015-09-23 DIAGNOSIS — Z9621 Cochlear implant status: Secondary | ICD-10-CM | POA: Diagnosis not present

## 2015-09-23 DIAGNOSIS — H9193 Unspecified hearing loss, bilateral: Secondary | ICD-10-CM | POA: Diagnosis not present

## 2015-09-23 DIAGNOSIS — H7012 Chronic mastoiditis, left ear: Secondary | ICD-10-CM | POA: Diagnosis not present

## 2015-09-23 DIAGNOSIS — H6122 Impacted cerumen, left ear: Secondary | ICD-10-CM | POA: Diagnosis not present

## 2015-11-02 DIAGNOSIS — E119 Type 2 diabetes mellitus without complications: Secondary | ICD-10-CM | POA: Diagnosis not present

## 2015-11-02 DIAGNOSIS — E7800 Pure hypercholesterolemia, unspecified: Secondary | ICD-10-CM | POA: Diagnosis not present

## 2015-11-17 DIAGNOSIS — H0011 Chalazion right upper eyelid: Secondary | ICD-10-CM | POA: Diagnosis not present

## 2015-12-03 DIAGNOSIS — H903 Sensorineural hearing loss, bilateral: Secondary | ICD-10-CM | POA: Diagnosis not present

## 2015-12-21 DIAGNOSIS — N401 Enlarged prostate with lower urinary tract symptoms: Secondary | ICD-10-CM | POA: Diagnosis not present

## 2015-12-21 DIAGNOSIS — Z299 Encounter for prophylactic measures, unspecified: Secondary | ICD-10-CM | POA: Diagnosis not present

## 2015-12-21 DIAGNOSIS — I10 Essential (primary) hypertension: Secondary | ICD-10-CM | POA: Diagnosis not present

## 2015-12-21 DIAGNOSIS — E1122 Type 2 diabetes mellitus with diabetic chronic kidney disease: Secondary | ICD-10-CM | POA: Diagnosis not present

## 2015-12-22 DIAGNOSIS — Z91041 Radiographic dye allergy status: Secondary | ICD-10-CM | POA: Diagnosis not present

## 2015-12-22 DIAGNOSIS — Z7984 Long term (current) use of oral hypoglycemic drugs: Secondary | ICD-10-CM | POA: Diagnosis not present

## 2015-12-22 DIAGNOSIS — Z885 Allergy status to narcotic agent status: Secondary | ICD-10-CM | POA: Diagnosis not present

## 2015-12-22 DIAGNOSIS — R42 Dizziness and giddiness: Secondary | ICD-10-CM | POA: Diagnosis not present

## 2015-12-22 DIAGNOSIS — E119 Type 2 diabetes mellitus without complications: Secondary | ICD-10-CM | POA: Diagnosis not present

## 2015-12-22 DIAGNOSIS — I679 Cerebrovascular disease, unspecified: Secondary | ICD-10-CM | POA: Diagnosis not present

## 2015-12-22 DIAGNOSIS — Z45321 Encounter for adjustment and management of cochlear device: Secondary | ICD-10-CM | POA: Diagnosis not present

## 2015-12-22 DIAGNOSIS — H7012 Chronic mastoiditis, left ear: Secondary | ICD-10-CM | POA: Diagnosis not present

## 2015-12-22 DIAGNOSIS — Z9621 Cochlear implant status: Secondary | ICD-10-CM | POA: Diagnosis not present

## 2015-12-22 DIAGNOSIS — Z8673 Personal history of transient ischemic attack (TIA), and cerebral infarction without residual deficits: Secondary | ICD-10-CM | POA: Diagnosis not present

## 2015-12-22 DIAGNOSIS — I639 Cerebral infarction, unspecified: Secondary | ICD-10-CM | POA: Diagnosis not present

## 2015-12-22 DIAGNOSIS — Z87891 Personal history of nicotine dependence: Secondary | ICD-10-CM | POA: Diagnosis not present

## 2015-12-22 DIAGNOSIS — Z888 Allergy status to other drugs, medicaments and biological substances status: Secondary | ICD-10-CM | POA: Diagnosis not present

## 2016-01-03 DIAGNOSIS — E7800 Pure hypercholesterolemia, unspecified: Secondary | ICD-10-CM | POA: Diagnosis not present

## 2016-01-03 DIAGNOSIS — E119 Type 2 diabetes mellitus without complications: Secondary | ICD-10-CM | POA: Diagnosis not present

## 2016-01-25 DIAGNOSIS — E7800 Pure hypercholesterolemia, unspecified: Secondary | ICD-10-CM | POA: Diagnosis not present

## 2016-01-25 DIAGNOSIS — Z0000 Encounter for general adult medical examination without abnormal findings: Secondary | ICD-10-CM | POA: Diagnosis not present

## 2016-01-25 DIAGNOSIS — Z6826 Body mass index (BMI) 26.0-26.9, adult: Secondary | ICD-10-CM | POA: Diagnosis not present

## 2016-01-25 DIAGNOSIS — Z1389 Encounter for screening for other disorder: Secondary | ICD-10-CM | POA: Diagnosis not present

## 2016-01-25 DIAGNOSIS — Z7189 Other specified counseling: Secondary | ICD-10-CM | POA: Diagnosis not present

## 2016-01-25 DIAGNOSIS — Z299 Encounter for prophylactic measures, unspecified: Secondary | ICD-10-CM | POA: Diagnosis not present

## 2016-01-25 DIAGNOSIS — Z1211 Encounter for screening for malignant neoplasm of colon: Secondary | ICD-10-CM | POA: Diagnosis not present

## 2016-01-25 DIAGNOSIS — Z79899 Other long term (current) drug therapy: Secondary | ICD-10-CM | POA: Diagnosis not present

## 2016-01-25 DIAGNOSIS — Z125 Encounter for screening for malignant neoplasm of prostate: Secondary | ICD-10-CM | POA: Diagnosis not present

## 2016-01-25 DIAGNOSIS — R5383 Other fatigue: Secondary | ICD-10-CM | POA: Diagnosis not present

## 2016-02-06 DIAGNOSIS — E7800 Pure hypercholesterolemia, unspecified: Secondary | ICD-10-CM | POA: Diagnosis not present

## 2016-02-06 DIAGNOSIS — E119 Type 2 diabetes mellitus without complications: Secondary | ICD-10-CM | POA: Diagnosis not present

## 2016-02-14 DIAGNOSIS — N401 Enlarged prostate with lower urinary tract symptoms: Secondary | ICD-10-CM | POA: Diagnosis not present

## 2016-02-17 ENCOUNTER — Ambulatory Visit (INDEPENDENT_AMBULATORY_CARE_PROVIDER_SITE_OTHER): Payer: Medicare Other | Admitting: Urology

## 2016-02-17 ENCOUNTER — Other Ambulatory Visit (HOSPITAL_COMMUNITY)
Admission: RE | Admit: 2016-02-17 | Discharge: 2016-02-17 | Disposition: A | Discharge status: Home / Self Care | Payer: Medicare Other | Source: Other Acute Inpatient Hospital | Attending: Urology | Admitting: Urology

## 2016-02-17 DIAGNOSIS — N401 Enlarged prostate with lower urinary tract symptoms: Secondary | ICD-10-CM | POA: Insufficient documentation

## 2016-02-17 DIAGNOSIS — N3281 Overactive bladder: Secondary | ICD-10-CM

## 2016-02-17 DIAGNOSIS — R972 Elevated prostate specific antigen [PSA]: Secondary | ICD-10-CM

## 2016-02-17 LAB — URINALYSIS, ROUTINE W REFLEX MICROSCOPIC
Bilirubin Urine: NEGATIVE
Glucose, UA: NEGATIVE mg/dL
Hgb urine dipstick: NEGATIVE
KETONES UR: NEGATIVE mg/dL
Leukocytes, UA: NEGATIVE
NITRITE: NEGATIVE
Protein, ur: NEGATIVE mg/dL
Specific Gravity, Urine: 1.015 (ref 1.005–1.030)
pH: 5.5 (ref 5.0–8.0)

## 2016-03-15 DIAGNOSIS — C6932 Malignant neoplasm of left choroid: Secondary | ICD-10-CM | POA: Diagnosis not present

## 2016-03-26 DIAGNOSIS — E119 Type 2 diabetes mellitus without complications: Secondary | ICD-10-CM | POA: Diagnosis not present

## 2016-03-26 DIAGNOSIS — E7800 Pure hypercholesterolemia, unspecified: Secondary | ICD-10-CM | POA: Diagnosis not present

## 2016-03-27 DIAGNOSIS — I10 Essential (primary) hypertension: Secondary | ICD-10-CM | POA: Diagnosis not present

## 2016-03-27 DIAGNOSIS — E1122 Type 2 diabetes mellitus with diabetic chronic kidney disease: Secondary | ICD-10-CM | POA: Diagnosis not present

## 2016-04-03 DIAGNOSIS — Z9621 Cochlear implant status: Secondary | ICD-10-CM | POA: Diagnosis not present

## 2016-04-03 DIAGNOSIS — Z885 Allergy status to narcotic agent status: Secondary | ICD-10-CM | POA: Diagnosis not present

## 2016-04-03 DIAGNOSIS — Z87891 Personal history of nicotine dependence: Secondary | ICD-10-CM | POA: Diagnosis not present

## 2016-04-03 DIAGNOSIS — Z888 Allergy status to other drugs, medicaments and biological substances status: Secondary | ICD-10-CM | POA: Diagnosis not present

## 2016-04-03 DIAGNOSIS — H7012 Chronic mastoiditis, left ear: Secondary | ICD-10-CM | POA: Diagnosis not present

## 2016-04-03 DIAGNOSIS — H903 Sensorineural hearing loss, bilateral: Secondary | ICD-10-CM | POA: Diagnosis not present

## 2016-04-03 DIAGNOSIS — R42 Dizziness and giddiness: Secondary | ICD-10-CM | POA: Diagnosis not present

## 2016-04-03 DIAGNOSIS — E119 Type 2 diabetes mellitus without complications: Secondary | ICD-10-CM | POA: Diagnosis not present

## 2016-04-03 DIAGNOSIS — H9190 Unspecified hearing loss, unspecified ear: Secondary | ICD-10-CM | POA: Diagnosis not present

## 2016-04-03 DIAGNOSIS — Z7984 Long term (current) use of oral hypoglycemic drugs: Secondary | ICD-10-CM | POA: Diagnosis not present

## 2016-04-03 DIAGNOSIS — Z8673 Personal history of transient ischemic attack (TIA), and cerebral infarction without residual deficits: Secondary | ICD-10-CM | POA: Diagnosis not present

## 2016-04-03 DIAGNOSIS — I639 Cerebral infarction, unspecified: Secondary | ICD-10-CM | POA: Diagnosis not present

## 2016-04-20 DIAGNOSIS — E119 Type 2 diabetes mellitus without complications: Secondary | ICD-10-CM | POA: Diagnosis not present

## 2016-04-20 DIAGNOSIS — E7800 Pure hypercholesterolemia, unspecified: Secondary | ICD-10-CM | POA: Diagnosis not present

## 2016-05-01 DIAGNOSIS — C6932 Malignant neoplasm of left choroid: Secondary | ICD-10-CM | POA: Diagnosis not present

## 2016-05-01 DIAGNOSIS — H3500 Unspecified background retinopathy: Secondary | ICD-10-CM | POA: Diagnosis not present

## 2016-05-01 DIAGNOSIS — H5201 Hypermetropia, right eye: Secondary | ICD-10-CM | POA: Diagnosis not present

## 2016-05-01 DIAGNOSIS — E119 Type 2 diabetes mellitus without complications: Secondary | ICD-10-CM | POA: Diagnosis not present

## 2016-06-05 ENCOUNTER — Ambulatory Visit (INDEPENDENT_AMBULATORY_CARE_PROVIDER_SITE_OTHER): Payer: Medicare Other | Admitting: *Deleted

## 2016-06-05 DIAGNOSIS — Z23 Encounter for immunization: Secondary | ICD-10-CM | POA: Diagnosis not present

## 2016-06-19 DIAGNOSIS — H903 Sensorineural hearing loss, bilateral: Secondary | ICD-10-CM | POA: Diagnosis not present

## 2016-06-20 DIAGNOSIS — E119 Type 2 diabetes mellitus without complications: Secondary | ICD-10-CM | POA: Diagnosis not present

## 2016-06-20 DIAGNOSIS — E7800 Pure hypercholesterolemia, unspecified: Secondary | ICD-10-CM | POA: Diagnosis not present

## 2016-07-03 DIAGNOSIS — Z299 Encounter for prophylactic measures, unspecified: Secondary | ICD-10-CM | POA: Diagnosis not present

## 2016-07-03 DIAGNOSIS — N400 Enlarged prostate without lower urinary tract symptoms: Secondary | ICD-10-CM | POA: Diagnosis not present

## 2016-07-03 DIAGNOSIS — R609 Edema, unspecified: Secondary | ICD-10-CM | POA: Diagnosis not present

## 2016-07-03 DIAGNOSIS — I10 Essential (primary) hypertension: Secondary | ICD-10-CM | POA: Diagnosis not present

## 2016-07-03 DIAGNOSIS — E1122 Type 2 diabetes mellitus with diabetic chronic kidney disease: Secondary | ICD-10-CM | POA: Diagnosis not present

## 2016-07-05 DIAGNOSIS — H7012 Chronic mastoiditis, left ear: Secondary | ICD-10-CM | POA: Diagnosis not present

## 2016-07-05 DIAGNOSIS — Z888 Allergy status to other drugs, medicaments and biological substances status: Secondary | ICD-10-CM | POA: Diagnosis not present

## 2016-07-05 DIAGNOSIS — Z885 Allergy status to narcotic agent status: Secondary | ICD-10-CM | POA: Diagnosis not present

## 2016-07-05 DIAGNOSIS — Z9621 Cochlear implant status: Secondary | ICD-10-CM | POA: Diagnosis not present

## 2016-07-05 DIAGNOSIS — Z87891 Personal history of nicotine dependence: Secondary | ICD-10-CM | POA: Diagnosis not present

## 2016-07-05 DIAGNOSIS — I639 Cerebral infarction, unspecified: Secondary | ICD-10-CM | POA: Diagnosis not present

## 2016-07-05 DIAGNOSIS — R42 Dizziness and giddiness: Secondary | ICD-10-CM | POA: Diagnosis not present

## 2016-07-05 DIAGNOSIS — Z7984 Long term (current) use of oral hypoglycemic drugs: Secondary | ICD-10-CM | POA: Diagnosis not present

## 2016-07-05 DIAGNOSIS — E119 Type 2 diabetes mellitus without complications: Secondary | ICD-10-CM | POA: Diagnosis not present

## 2016-07-05 DIAGNOSIS — H903 Sensorineural hearing loss, bilateral: Secondary | ICD-10-CM | POA: Diagnosis not present

## 2016-07-05 DIAGNOSIS — Z8673 Personal history of transient ischemic attack (TIA), and cerebral infarction without residual deficits: Secondary | ICD-10-CM | POA: Diagnosis not present

## 2016-08-23 DIAGNOSIS — E119 Type 2 diabetes mellitus without complications: Secondary | ICD-10-CM | POA: Diagnosis not present

## 2016-08-23 DIAGNOSIS — E7800 Pure hypercholesterolemia, unspecified: Secondary | ICD-10-CM | POA: Diagnosis not present

## 2016-08-31 DIAGNOSIS — E1122 Type 2 diabetes mellitus with diabetic chronic kidney disease: Secondary | ICD-10-CM | POA: Diagnosis not present

## 2016-08-31 DIAGNOSIS — Z87891 Personal history of nicotine dependence: Secondary | ICD-10-CM | POA: Diagnosis not present

## 2016-08-31 DIAGNOSIS — Z299 Encounter for prophylactic measures, unspecified: Secondary | ICD-10-CM | POA: Diagnosis not present

## 2016-08-31 DIAGNOSIS — J329 Chronic sinusitis, unspecified: Secondary | ICD-10-CM | POA: Diagnosis not present

## 2016-08-31 DIAGNOSIS — L97909 Non-pressure chronic ulcer of unspecified part of unspecified lower leg with unspecified severity: Secondary | ICD-10-CM | POA: Diagnosis not present

## 2016-09-21 DIAGNOSIS — E7800 Pure hypercholesterolemia, unspecified: Secondary | ICD-10-CM | POA: Diagnosis not present

## 2016-09-21 DIAGNOSIS — E119 Type 2 diabetes mellitus without complications: Secondary | ICD-10-CM | POA: Diagnosis not present

## 2016-10-05 DIAGNOSIS — R269 Unspecified abnormalities of gait and mobility: Secondary | ICD-10-CM | POA: Diagnosis not present

## 2016-10-05 DIAGNOSIS — H7012 Chronic mastoiditis, left ear: Secondary | ICD-10-CM | POA: Diagnosis not present

## 2016-10-05 DIAGNOSIS — Z888 Allergy status to other drugs, medicaments and biological substances status: Secondary | ICD-10-CM | POA: Diagnosis not present

## 2016-10-05 DIAGNOSIS — Z87891 Personal history of nicotine dependence: Secondary | ICD-10-CM | POA: Diagnosis not present

## 2016-10-05 DIAGNOSIS — Z7984 Long term (current) use of oral hypoglycemic drugs: Secondary | ICD-10-CM | POA: Diagnosis not present

## 2016-10-05 DIAGNOSIS — Z885 Allergy status to narcotic agent status: Secondary | ICD-10-CM | POA: Diagnosis not present

## 2016-10-05 DIAGNOSIS — Z9621 Cochlear implant status: Secondary | ICD-10-CM | POA: Diagnosis not present

## 2016-10-05 DIAGNOSIS — Z8673 Personal history of transient ischemic attack (TIA), and cerebral infarction without residual deficits: Secondary | ICD-10-CM | POA: Diagnosis not present

## 2016-10-05 DIAGNOSIS — I639 Cerebral infarction, unspecified: Secondary | ICD-10-CM | POA: Diagnosis not present

## 2016-10-05 DIAGNOSIS — I679 Cerebrovascular disease, unspecified: Secondary | ICD-10-CM | POA: Diagnosis not present

## 2016-10-05 DIAGNOSIS — E109 Type 1 diabetes mellitus without complications: Secondary | ICD-10-CM | POA: Diagnosis not present

## 2016-10-05 DIAGNOSIS — Z7982 Long term (current) use of aspirin: Secondary | ICD-10-CM | POA: Diagnosis not present

## 2016-10-09 DIAGNOSIS — E1122 Type 2 diabetes mellitus with diabetic chronic kidney disease: Secondary | ICD-10-CM | POA: Diagnosis not present

## 2016-10-09 DIAGNOSIS — Z713 Dietary counseling and surveillance: Secondary | ICD-10-CM | POA: Diagnosis not present

## 2016-10-09 DIAGNOSIS — N183 Chronic kidney disease, stage 3 (moderate): Secondary | ICD-10-CM | POA: Diagnosis not present

## 2016-10-09 DIAGNOSIS — Z299 Encounter for prophylactic measures, unspecified: Secondary | ICD-10-CM | POA: Diagnosis not present

## 2016-10-09 DIAGNOSIS — I10 Essential (primary) hypertension: Secondary | ICD-10-CM | POA: Diagnosis not present

## 2016-10-11 DIAGNOSIS — E7800 Pure hypercholesterolemia, unspecified: Secondary | ICD-10-CM | POA: Diagnosis not present

## 2016-10-11 DIAGNOSIS — E119 Type 2 diabetes mellitus without complications: Secondary | ICD-10-CM | POA: Diagnosis not present

## 2016-11-08 DIAGNOSIS — E7800 Pure hypercholesterolemia, unspecified: Secondary | ICD-10-CM | POA: Diagnosis not present

## 2016-11-08 DIAGNOSIS — E119 Type 2 diabetes mellitus without complications: Secondary | ICD-10-CM | POA: Diagnosis not present

## 2016-11-20 DIAGNOSIS — L03032 Cellulitis of left toe: Secondary | ICD-10-CM | POA: Diagnosis not present

## 2016-11-20 DIAGNOSIS — M79675 Pain in left toe(s): Secondary | ICD-10-CM | POA: Diagnosis not present

## 2016-12-06 DIAGNOSIS — L03031 Cellulitis of right toe: Secondary | ICD-10-CM | POA: Diagnosis not present

## 2016-12-06 DIAGNOSIS — B351 Tinea unguium: Secondary | ICD-10-CM | POA: Diagnosis not present

## 2016-12-14 DIAGNOSIS — E7800 Pure hypercholesterolemia, unspecified: Secondary | ICD-10-CM | POA: Diagnosis not present

## 2016-12-14 DIAGNOSIS — E119 Type 2 diabetes mellitus without complications: Secondary | ICD-10-CM | POA: Diagnosis not present

## 2017-01-08 DIAGNOSIS — E1122 Type 2 diabetes mellitus with diabetic chronic kidney disease: Secondary | ICD-10-CM | POA: Diagnosis not present

## 2017-01-08 DIAGNOSIS — Z299 Encounter for prophylactic measures, unspecified: Secondary | ICD-10-CM | POA: Diagnosis not present

## 2017-01-08 DIAGNOSIS — G459 Transient cerebral ischemic attack, unspecified: Secondary | ICD-10-CM | POA: Diagnosis not present

## 2017-01-08 DIAGNOSIS — N400 Enlarged prostate without lower urinary tract symptoms: Secondary | ICD-10-CM | POA: Diagnosis not present

## 2017-01-08 DIAGNOSIS — Z713 Dietary counseling and surveillance: Secondary | ICD-10-CM | POA: Diagnosis not present

## 2017-01-08 DIAGNOSIS — I10 Essential (primary) hypertension: Secondary | ICD-10-CM | POA: Diagnosis not present

## 2017-01-08 DIAGNOSIS — E663 Overweight: Secondary | ICD-10-CM | POA: Diagnosis not present

## 2017-01-08 DIAGNOSIS — N183 Chronic kidney disease, stage 3 (moderate): Secondary | ICD-10-CM | POA: Diagnosis not present

## 2017-01-08 DIAGNOSIS — E7800 Pure hypercholesterolemia, unspecified: Secondary | ICD-10-CM | POA: Diagnosis not present

## 2017-01-22 DIAGNOSIS — Z9621 Cochlear implant status: Secondary | ICD-10-CM | POA: Diagnosis not present

## 2017-01-22 DIAGNOSIS — H9193 Unspecified hearing loss, bilateral: Secondary | ICD-10-CM | POA: Diagnosis not present

## 2017-01-22 DIAGNOSIS — H7012 Chronic mastoiditis, left ear: Secondary | ICD-10-CM | POA: Diagnosis not present

## 2017-01-22 DIAGNOSIS — R972 Elevated prostate specific antigen [PSA]: Secondary | ICD-10-CM | POA: Diagnosis not present

## 2017-01-22 DIAGNOSIS — Z885 Allergy status to narcotic agent status: Secondary | ICD-10-CM | POA: Diagnosis not present

## 2017-01-22 DIAGNOSIS — Z87891 Personal history of nicotine dependence: Secondary | ICD-10-CM | POA: Diagnosis not present

## 2017-01-22 DIAGNOSIS — Z888 Allergy status to other drugs, medicaments and biological substances status: Secondary | ICD-10-CM | POA: Diagnosis not present

## 2017-01-22 DIAGNOSIS — E119 Type 2 diabetes mellitus without complications: Secondary | ICD-10-CM | POA: Diagnosis not present

## 2017-01-22 DIAGNOSIS — Z7984 Long term (current) use of oral hypoglycemic drugs: Secondary | ICD-10-CM | POA: Diagnosis not present

## 2017-01-22 DIAGNOSIS — Z8673 Personal history of transient ischemic attack (TIA), and cerebral infarction without residual deficits: Secondary | ICD-10-CM | POA: Diagnosis not present

## 2017-01-25 ENCOUNTER — Ambulatory Visit (INDEPENDENT_AMBULATORY_CARE_PROVIDER_SITE_OTHER): Payer: Medicare Other | Admitting: Urology

## 2017-01-25 DIAGNOSIS — R972 Elevated prostate specific antigen [PSA]: Secondary | ICD-10-CM

## 2017-01-25 DIAGNOSIS — N3941 Urge incontinence: Secondary | ICD-10-CM | POA: Diagnosis not present

## 2017-01-25 DIAGNOSIS — N401 Enlarged prostate with lower urinary tract symptoms: Secondary | ICD-10-CM

## 2017-01-29 DIAGNOSIS — R5383 Other fatigue: Secondary | ICD-10-CM | POA: Diagnosis not present

## 2017-01-29 DIAGNOSIS — E1122 Type 2 diabetes mellitus with diabetic chronic kidney disease: Secondary | ICD-10-CM | POA: Diagnosis not present

## 2017-01-29 DIAGNOSIS — Z79899 Other long term (current) drug therapy: Secondary | ICD-10-CM | POA: Diagnosis not present

## 2017-01-29 DIAGNOSIS — Z0000 Encounter for general adult medical examination without abnormal findings: Secondary | ICD-10-CM | POA: Diagnosis not present

## 2017-01-29 DIAGNOSIS — Z1389 Encounter for screening for other disorder: Secondary | ICD-10-CM | POA: Diagnosis not present

## 2017-01-29 DIAGNOSIS — Z6826 Body mass index (BMI) 26.0-26.9, adult: Secondary | ICD-10-CM | POA: Diagnosis not present

## 2017-01-29 DIAGNOSIS — G459 Transient cerebral ischemic attack, unspecified: Secondary | ICD-10-CM | POA: Diagnosis not present

## 2017-01-29 DIAGNOSIS — N183 Chronic kidney disease, stage 3 (moderate): Secondary | ICD-10-CM | POA: Diagnosis not present

## 2017-01-29 DIAGNOSIS — I10 Essential (primary) hypertension: Secondary | ICD-10-CM | POA: Diagnosis not present

## 2017-01-29 DIAGNOSIS — Z299 Encounter for prophylactic measures, unspecified: Secondary | ICD-10-CM | POA: Diagnosis not present

## 2017-01-29 DIAGNOSIS — Z7189 Other specified counseling: Secondary | ICD-10-CM | POA: Diagnosis not present

## 2017-01-29 DIAGNOSIS — E7800 Pure hypercholesterolemia, unspecified: Secondary | ICD-10-CM | POA: Diagnosis not present

## 2017-01-29 DIAGNOSIS — Z125 Encounter for screening for malignant neoplasm of prostate: Secondary | ICD-10-CM | POA: Diagnosis not present

## 2017-01-29 DIAGNOSIS — Z1211 Encounter for screening for malignant neoplasm of colon: Secondary | ICD-10-CM | POA: Diagnosis not present

## 2017-02-06 DIAGNOSIS — E119 Type 2 diabetes mellitus without complications: Secondary | ICD-10-CM | POA: Diagnosis not present

## 2017-02-06 DIAGNOSIS — E7800 Pure hypercholesterolemia, unspecified: Secondary | ICD-10-CM | POA: Diagnosis not present

## 2017-03-12 DIAGNOSIS — B351 Tinea unguium: Secondary | ICD-10-CM | POA: Diagnosis not present

## 2017-03-12 DIAGNOSIS — M79676 Pain in unspecified toe(s): Secondary | ICD-10-CM | POA: Diagnosis not present

## 2017-04-08 DIAGNOSIS — E119 Type 2 diabetes mellitus without complications: Secondary | ICD-10-CM | POA: Diagnosis not present

## 2017-04-08 DIAGNOSIS — E7800 Pure hypercholesterolemia, unspecified: Secondary | ICD-10-CM | POA: Diagnosis not present

## 2017-04-16 DIAGNOSIS — Z713 Dietary counseling and surveillance: Secondary | ICD-10-CM | POA: Diagnosis not present

## 2017-04-16 DIAGNOSIS — N183 Chronic kidney disease, stage 3 (moderate): Secondary | ICD-10-CM | POA: Diagnosis not present

## 2017-04-16 DIAGNOSIS — N400 Enlarged prostate without lower urinary tract symptoms: Secondary | ICD-10-CM | POA: Diagnosis not present

## 2017-04-16 DIAGNOSIS — E1165 Type 2 diabetes mellitus with hyperglycemia: Secondary | ICD-10-CM | POA: Diagnosis not present

## 2017-04-16 DIAGNOSIS — Z299 Encounter for prophylactic measures, unspecified: Secondary | ICD-10-CM | POA: Diagnosis not present

## 2017-04-16 DIAGNOSIS — Z6826 Body mass index (BMI) 26.0-26.9, adult: Secondary | ICD-10-CM | POA: Diagnosis not present

## 2017-04-16 DIAGNOSIS — I10 Essential (primary) hypertension: Secondary | ICD-10-CM | POA: Diagnosis not present

## 2017-04-16 DIAGNOSIS — E7800 Pure hypercholesterolemia, unspecified: Secondary | ICD-10-CM | POA: Diagnosis not present

## 2017-04-16 DIAGNOSIS — E1122 Type 2 diabetes mellitus with diabetic chronic kidney disease: Secondary | ICD-10-CM | POA: Diagnosis not present

## 2017-04-20 DIAGNOSIS — H903 Sensorineural hearing loss, bilateral: Secondary | ICD-10-CM | POA: Diagnosis not present

## 2017-05-01 DIAGNOSIS — C6932 Malignant neoplasm of left choroid: Secondary | ICD-10-CM | POA: Diagnosis not present

## 2017-05-01 DIAGNOSIS — H3500 Unspecified background retinopathy: Secondary | ICD-10-CM | POA: Diagnosis not present

## 2017-05-01 DIAGNOSIS — H5201 Hypermetropia, right eye: Secondary | ICD-10-CM | POA: Diagnosis not present

## 2017-05-01 DIAGNOSIS — E119 Type 2 diabetes mellitus without complications: Secondary | ICD-10-CM | POA: Diagnosis not present

## 2017-05-06 DIAGNOSIS — E7800 Pure hypercholesterolemia, unspecified: Secondary | ICD-10-CM | POA: Diagnosis not present

## 2017-05-06 DIAGNOSIS — E119 Type 2 diabetes mellitus without complications: Secondary | ICD-10-CM | POA: Diagnosis not present

## 2017-05-21 DIAGNOSIS — I679 Cerebrovascular disease, unspecified: Secondary | ICD-10-CM | POA: Diagnosis not present

## 2017-05-21 DIAGNOSIS — H7012 Chronic mastoiditis, left ear: Secondary | ICD-10-CM | POA: Diagnosis not present

## 2017-05-21 DIAGNOSIS — H9190 Unspecified hearing loss, unspecified ear: Secondary | ICD-10-CM | POA: Diagnosis not present

## 2017-05-21 DIAGNOSIS — H918X3 Other specified hearing loss, bilateral: Secondary | ICD-10-CM | POA: Diagnosis not present

## 2017-05-21 DIAGNOSIS — Z9621 Cochlear implant status: Secondary | ICD-10-CM | POA: Diagnosis not present

## 2017-05-21 DIAGNOSIS — H903 Sensorineural hearing loss, bilateral: Secondary | ICD-10-CM | POA: Diagnosis not present

## 2017-05-30 DIAGNOSIS — E7800 Pure hypercholesterolemia, unspecified: Secondary | ICD-10-CM | POA: Diagnosis not present

## 2017-05-30 DIAGNOSIS — E119 Type 2 diabetes mellitus without complications: Secondary | ICD-10-CM | POA: Diagnosis not present

## 2017-06-25 ENCOUNTER — Ambulatory Visit (INDEPENDENT_AMBULATORY_CARE_PROVIDER_SITE_OTHER): Payer: Medicare Other | Admitting: *Deleted

## 2017-06-25 DIAGNOSIS — Z23 Encounter for immunization: Secondary | ICD-10-CM | POA: Diagnosis not present

## 2017-07-01 DIAGNOSIS — E7800 Pure hypercholesterolemia, unspecified: Secondary | ICD-10-CM | POA: Diagnosis not present

## 2017-07-01 DIAGNOSIS — E119 Type 2 diabetes mellitus without complications: Secondary | ICD-10-CM | POA: Diagnosis not present

## 2017-07-02 DIAGNOSIS — C6932 Malignant neoplasm of left choroid: Secondary | ICD-10-CM | POA: Diagnosis not present

## 2017-07-02 DIAGNOSIS — T66XXXA Radiation sickness, unspecified, initial encounter: Secondary | ICD-10-CM | POA: Insufficient documentation

## 2017-07-02 DIAGNOSIS — Z8673 Personal history of transient ischemic attack (TIA), and cerebral infarction without residual deficits: Secondary | ICD-10-CM | POA: Diagnosis not present

## 2017-07-02 DIAGNOSIS — H3589 Other specified retinal disorders: Secondary | ICD-10-CM | POA: Diagnosis not present

## 2017-07-02 DIAGNOSIS — Z961 Presence of intraocular lens: Secondary | ICD-10-CM | POA: Diagnosis not present

## 2017-07-02 DIAGNOSIS — E119 Type 2 diabetes mellitus without complications: Secondary | ICD-10-CM | POA: Insufficient documentation

## 2017-07-02 DIAGNOSIS — Z7984 Long term (current) use of oral hypoglycemic drugs: Secondary | ICD-10-CM | POA: Diagnosis not present

## 2017-07-02 DIAGNOSIS — H47012 Ischemic optic neuropathy, left eye: Secondary | ICD-10-CM | POA: Insufficient documentation

## 2017-07-30 DIAGNOSIS — E1165 Type 2 diabetes mellitus with hyperglycemia: Secondary | ICD-10-CM | POA: Diagnosis not present

## 2017-07-30 DIAGNOSIS — N183 Chronic kidney disease, stage 3 (moderate): Secondary | ICD-10-CM | POA: Diagnosis not present

## 2017-07-30 DIAGNOSIS — E1122 Type 2 diabetes mellitus with diabetic chronic kidney disease: Secondary | ICD-10-CM | POA: Diagnosis not present

## 2017-07-30 DIAGNOSIS — Z299 Encounter for prophylactic measures, unspecified: Secondary | ICD-10-CM | POA: Diagnosis not present

## 2017-07-30 DIAGNOSIS — B351 Tinea unguium: Secondary | ICD-10-CM | POA: Diagnosis not present

## 2017-07-30 DIAGNOSIS — M79676 Pain in unspecified toe(s): Secondary | ICD-10-CM | POA: Diagnosis not present

## 2017-07-30 DIAGNOSIS — Z6826 Body mass index (BMI) 26.0-26.9, adult: Secondary | ICD-10-CM | POA: Diagnosis not present

## 2017-08-06 DIAGNOSIS — E119 Type 2 diabetes mellitus without complications: Secondary | ICD-10-CM | POA: Diagnosis not present

## 2017-08-06 DIAGNOSIS — E7800 Pure hypercholesterolemia, unspecified: Secondary | ICD-10-CM | POA: Diagnosis not present

## 2017-09-13 DIAGNOSIS — Z8673 Personal history of transient ischemic attack (TIA), and cerebral infarction without residual deficits: Secondary | ICD-10-CM | POA: Diagnosis not present

## 2017-09-13 DIAGNOSIS — H9190 Unspecified hearing loss, unspecified ear: Secondary | ICD-10-CM | POA: Diagnosis not present

## 2017-09-13 DIAGNOSIS — R42 Dizziness and giddiness: Secondary | ICD-10-CM | POA: Diagnosis not present

## 2017-09-13 DIAGNOSIS — H6122 Impacted cerumen, left ear: Secondary | ICD-10-CM | POA: Diagnosis not present

## 2017-09-13 DIAGNOSIS — H7012 Chronic mastoiditis, left ear: Secondary | ICD-10-CM | POA: Diagnosis not present

## 2017-09-13 DIAGNOSIS — Z9621 Cochlear implant status: Secondary | ICD-10-CM | POA: Diagnosis not present

## 2017-09-13 DIAGNOSIS — H9193 Unspecified hearing loss, bilateral: Secondary | ICD-10-CM | POA: Diagnosis not present

## 2017-10-10 DIAGNOSIS — E7800 Pure hypercholesterolemia, unspecified: Secondary | ICD-10-CM | POA: Diagnosis not present

## 2017-10-10 DIAGNOSIS — E119 Type 2 diabetes mellitus without complications: Secondary | ICD-10-CM | POA: Diagnosis not present

## 2017-11-19 DIAGNOSIS — I10 Essential (primary) hypertension: Secondary | ICD-10-CM | POA: Diagnosis not present

## 2017-11-19 DIAGNOSIS — E1165 Type 2 diabetes mellitus with hyperglycemia: Secondary | ICD-10-CM | POA: Diagnosis not present

## 2017-11-19 DIAGNOSIS — B354 Tinea corporis: Secondary | ICD-10-CM | POA: Diagnosis not present

## 2017-11-19 DIAGNOSIS — Z299 Encounter for prophylactic measures, unspecified: Secondary | ICD-10-CM | POA: Diagnosis not present

## 2017-11-19 DIAGNOSIS — N183 Chronic kidney disease, stage 3 (moderate): Secondary | ICD-10-CM | POA: Diagnosis not present

## 2017-11-19 DIAGNOSIS — Z6826 Body mass index (BMI) 26.0-26.9, adult: Secondary | ICD-10-CM | POA: Diagnosis not present

## 2017-11-19 DIAGNOSIS — E1122 Type 2 diabetes mellitus with diabetic chronic kidney disease: Secondary | ICD-10-CM | POA: Diagnosis not present

## 2017-12-31 DIAGNOSIS — Z9621 Cochlear implant status: Secondary | ICD-10-CM | POA: Diagnosis not present

## 2017-12-31 DIAGNOSIS — Z8673 Personal history of transient ischemic attack (TIA), and cerebral infarction without residual deficits: Secondary | ICD-10-CM | POA: Diagnosis not present

## 2017-12-31 DIAGNOSIS — R42 Dizziness and giddiness: Secondary | ICD-10-CM | POA: Diagnosis not present

## 2017-12-31 DIAGNOSIS — H918X3 Other specified hearing loss, bilateral: Secondary | ICD-10-CM | POA: Diagnosis not present

## 2017-12-31 DIAGNOSIS — H7012 Chronic mastoiditis, left ear: Secondary | ICD-10-CM | POA: Diagnosis not present

## 2017-12-31 DIAGNOSIS — I679 Cerebrovascular disease, unspecified: Secondary | ICD-10-CM | POA: Diagnosis not present

## 2017-12-31 DIAGNOSIS — Z9889 Other specified postprocedural states: Secondary | ICD-10-CM | POA: Diagnosis not present

## 2017-12-31 DIAGNOSIS — Z87891 Personal history of nicotine dependence: Secondary | ICD-10-CM | POA: Diagnosis not present

## 2018-01-28 DIAGNOSIS — E119 Type 2 diabetes mellitus without complications: Secondary | ICD-10-CM | POA: Diagnosis not present

## 2018-01-28 DIAGNOSIS — E7800 Pure hypercholesterolemia, unspecified: Secondary | ICD-10-CM | POA: Diagnosis not present

## 2018-02-06 DIAGNOSIS — Z6826 Body mass index (BMI) 26.0-26.9, adult: Secondary | ICD-10-CM | POA: Diagnosis not present

## 2018-02-06 DIAGNOSIS — E1165 Type 2 diabetes mellitus with hyperglycemia: Secondary | ICD-10-CM | POA: Diagnosis not present

## 2018-02-06 DIAGNOSIS — E7800 Pure hypercholesterolemia, unspecified: Secondary | ICD-10-CM | POA: Diagnosis not present

## 2018-02-06 DIAGNOSIS — Z7189 Other specified counseling: Secondary | ICD-10-CM | POA: Diagnosis not present

## 2018-02-06 DIAGNOSIS — I10 Essential (primary) hypertension: Secondary | ICD-10-CM | POA: Diagnosis not present

## 2018-02-06 DIAGNOSIS — Z299 Encounter for prophylactic measures, unspecified: Secondary | ICD-10-CM | POA: Diagnosis not present

## 2018-02-06 DIAGNOSIS — R5383 Other fatigue: Secondary | ICD-10-CM | POA: Diagnosis not present

## 2018-02-06 DIAGNOSIS — Z0000 Encounter for general adult medical examination without abnormal findings: Secondary | ICD-10-CM | POA: Diagnosis not present

## 2018-02-06 DIAGNOSIS — Z79899 Other long term (current) drug therapy: Secondary | ICD-10-CM | POA: Diagnosis not present

## 2018-02-06 DIAGNOSIS — Z125 Encounter for screening for malignant neoplasm of prostate: Secondary | ICD-10-CM | POA: Diagnosis not present

## 2018-02-06 DIAGNOSIS — Z1211 Encounter for screening for malignant neoplasm of colon: Secondary | ICD-10-CM | POA: Diagnosis not present

## 2018-02-06 DIAGNOSIS — Z1331 Encounter for screening for depression: Secondary | ICD-10-CM | POA: Diagnosis not present

## 2018-02-06 DIAGNOSIS — Z1339 Encounter for screening examination for other mental health and behavioral disorders: Secondary | ICD-10-CM | POA: Diagnosis not present

## 2018-02-07 ENCOUNTER — Ambulatory Visit (INDEPENDENT_AMBULATORY_CARE_PROVIDER_SITE_OTHER): Payer: Medicare Other | Admitting: Urology

## 2018-02-07 DIAGNOSIS — R351 Nocturia: Secondary | ICD-10-CM

## 2018-02-07 DIAGNOSIS — N401 Enlarged prostate with lower urinary tract symptoms: Secondary | ICD-10-CM | POA: Diagnosis not present

## 2018-02-07 DIAGNOSIS — R972 Elevated prostate specific antigen [PSA]: Secondary | ICD-10-CM

## 2018-02-07 DIAGNOSIS — N3281 Overactive bladder: Secondary | ICD-10-CM | POA: Diagnosis not present

## 2018-03-18 DIAGNOSIS — E1122 Type 2 diabetes mellitus with diabetic chronic kidney disease: Secondary | ICD-10-CM | POA: Diagnosis not present

## 2018-03-18 DIAGNOSIS — Z299 Encounter for prophylactic measures, unspecified: Secondary | ICD-10-CM | POA: Diagnosis not present

## 2018-03-18 DIAGNOSIS — I10 Essential (primary) hypertension: Secondary | ICD-10-CM | POA: Diagnosis not present

## 2018-03-18 DIAGNOSIS — N183 Chronic kidney disease, stage 3 (moderate): Secondary | ICD-10-CM | POA: Diagnosis not present

## 2018-03-18 DIAGNOSIS — E1165 Type 2 diabetes mellitus with hyperglycemia: Secondary | ICD-10-CM | POA: Diagnosis not present

## 2018-03-18 DIAGNOSIS — Z6826 Body mass index (BMI) 26.0-26.9, adult: Secondary | ICD-10-CM | POA: Diagnosis not present

## 2018-03-25 DIAGNOSIS — E119 Type 2 diabetes mellitus without complications: Secondary | ICD-10-CM | POA: Diagnosis not present

## 2018-03-25 DIAGNOSIS — E7800 Pure hypercholesterolemia, unspecified: Secondary | ICD-10-CM | POA: Diagnosis not present

## 2018-04-10 DIAGNOSIS — Z789 Other specified health status: Secondary | ICD-10-CM | POA: Diagnosis not present

## 2018-04-10 DIAGNOSIS — Z299 Encounter for prophylactic measures, unspecified: Secondary | ICD-10-CM | POA: Diagnosis not present

## 2018-04-10 DIAGNOSIS — R279 Unspecified lack of coordination: Secondary | ICD-10-CM | POA: Diagnosis not present

## 2018-04-10 DIAGNOSIS — R269 Unspecified abnormalities of gait and mobility: Secondary | ICD-10-CM | POA: Diagnosis not present

## 2018-04-10 DIAGNOSIS — R4182 Altered mental status, unspecified: Secondary | ICD-10-CM | POA: Diagnosis not present

## 2018-04-10 DIAGNOSIS — Z6826 Body mass index (BMI) 26.0-26.9, adult: Secondary | ICD-10-CM | POA: Diagnosis not present

## 2018-04-10 DIAGNOSIS — R531 Weakness: Secondary | ICD-10-CM | POA: Diagnosis not present

## 2018-04-10 DIAGNOSIS — R51 Headache: Secondary | ICD-10-CM | POA: Diagnosis not present

## 2018-04-22 DIAGNOSIS — E119 Type 2 diabetes mellitus without complications: Secondary | ICD-10-CM | POA: Diagnosis not present

## 2018-04-22 DIAGNOSIS — E7800 Pure hypercholesterolemia, unspecified: Secondary | ICD-10-CM | POA: Diagnosis not present

## 2018-05-05 DIAGNOSIS — C6932 Malignant neoplasm of left choroid: Secondary | ICD-10-CM | POA: Diagnosis not present

## 2018-05-05 DIAGNOSIS — H52201 Unspecified astigmatism, right eye: Secondary | ICD-10-CM | POA: Diagnosis not present

## 2018-05-05 DIAGNOSIS — E119 Type 2 diabetes mellitus without complications: Secondary | ICD-10-CM | POA: Diagnosis not present

## 2018-05-05 DIAGNOSIS — T2632XA Burns of other specified parts of left eye and adnexa, initial encounter: Secondary | ICD-10-CM | POA: Diagnosis not present

## 2018-05-06 DIAGNOSIS — I639 Cerebral infarction, unspecified: Secondary | ICD-10-CM | POA: Diagnosis not present

## 2018-05-06 DIAGNOSIS — H7012 Chronic mastoiditis, left ear: Secondary | ICD-10-CM | POA: Diagnosis not present

## 2018-05-06 DIAGNOSIS — Z885 Allergy status to narcotic agent status: Secondary | ICD-10-CM | POA: Diagnosis not present

## 2018-05-06 DIAGNOSIS — Z8673 Personal history of transient ischemic attack (TIA), and cerebral infarction without residual deficits: Secondary | ICD-10-CM | POA: Diagnosis not present

## 2018-05-06 DIAGNOSIS — H9190 Unspecified hearing loss, unspecified ear: Secondary | ICD-10-CM | POA: Diagnosis not present

## 2018-05-06 DIAGNOSIS — Z888 Allergy status to other drugs, medicaments and biological substances status: Secondary | ICD-10-CM | POA: Diagnosis not present

## 2018-05-15 DIAGNOSIS — E7800 Pure hypercholesterolemia, unspecified: Secondary | ICD-10-CM | POA: Diagnosis not present

## 2018-05-15 DIAGNOSIS — E119 Type 2 diabetes mellitus without complications: Secondary | ICD-10-CM | POA: Diagnosis not present

## 2018-06-02 DIAGNOSIS — H7012 Chronic mastoiditis, left ear: Secondary | ICD-10-CM | POA: Diagnosis not present

## 2018-06-02 DIAGNOSIS — Z9621 Cochlear implant status: Secondary | ICD-10-CM | POA: Diagnosis not present

## 2018-06-05 DIAGNOSIS — Z961 Presence of intraocular lens: Secondary | ICD-10-CM | POA: Diagnosis not present

## 2018-06-05 DIAGNOSIS — H47012 Ischemic optic neuropathy, left eye: Secondary | ICD-10-CM | POA: Diagnosis not present

## 2018-06-05 DIAGNOSIS — H3589 Other specified retinal disorders: Secondary | ICD-10-CM | POA: Diagnosis not present

## 2018-06-05 DIAGNOSIS — Z85840 Personal history of malignant neoplasm of eye: Secondary | ICD-10-CM | POA: Diagnosis not present

## 2018-06-05 DIAGNOSIS — R51 Headache: Secondary | ICD-10-CM | POA: Diagnosis not present

## 2018-06-05 DIAGNOSIS — T66XXXD Radiation sickness, unspecified, subsequent encounter: Secondary | ICD-10-CM | POA: Diagnosis not present

## 2018-06-05 DIAGNOSIS — E119 Type 2 diabetes mellitus without complications: Secondary | ICD-10-CM | POA: Diagnosis not present

## 2018-06-12 DIAGNOSIS — H9209 Otalgia, unspecified ear: Secondary | ICD-10-CM | POA: Diagnosis not present

## 2018-06-12 DIAGNOSIS — Z87891 Personal history of nicotine dependence: Secondary | ICD-10-CM | POA: Diagnosis not present

## 2018-06-12 DIAGNOSIS — Z299 Encounter for prophylactic measures, unspecified: Secondary | ICD-10-CM | POA: Diagnosis not present

## 2018-06-12 DIAGNOSIS — Z6826 Body mass index (BMI) 26.0-26.9, adult: Secondary | ICD-10-CM | POA: Diagnosis not present

## 2018-06-12 DIAGNOSIS — I10 Essential (primary) hypertension: Secondary | ICD-10-CM | POA: Diagnosis not present

## 2018-06-13 DIAGNOSIS — E7800 Pure hypercholesterolemia, unspecified: Secondary | ICD-10-CM | POA: Diagnosis not present

## 2018-06-13 DIAGNOSIS — E119 Type 2 diabetes mellitus without complications: Secondary | ICD-10-CM | POA: Diagnosis not present

## 2018-06-25 DIAGNOSIS — Z299 Encounter for prophylactic measures, unspecified: Secondary | ICD-10-CM | POA: Diagnosis not present

## 2018-06-25 DIAGNOSIS — R42 Dizziness and giddiness: Secondary | ICD-10-CM | POA: Diagnosis not present

## 2018-06-25 DIAGNOSIS — R51 Headache: Secondary | ICD-10-CM | POA: Diagnosis not present

## 2018-06-25 DIAGNOSIS — E1165 Type 2 diabetes mellitus with hyperglycemia: Secondary | ICD-10-CM | POA: Diagnosis not present

## 2018-06-25 DIAGNOSIS — I10 Essential (primary) hypertension: Secondary | ICD-10-CM | POA: Diagnosis not present

## 2018-06-25 DIAGNOSIS — Z6826 Body mass index (BMI) 26.0-26.9, adult: Secondary | ICD-10-CM | POA: Diagnosis not present

## 2018-06-25 DIAGNOSIS — M542 Cervicalgia: Secondary | ICD-10-CM | POA: Diagnosis not present

## 2018-06-25 DIAGNOSIS — M4802 Spinal stenosis, cervical region: Secondary | ICD-10-CM | POA: Diagnosis not present

## 2018-06-25 DIAGNOSIS — M47812 Spondylosis without myelopathy or radiculopathy, cervical region: Secondary | ICD-10-CM | POA: Diagnosis not present

## 2018-07-10 DIAGNOSIS — Z23 Encounter for immunization: Secondary | ICD-10-CM | POA: Diagnosis not present

## 2018-07-11 DIAGNOSIS — E7800 Pure hypercholesterolemia, unspecified: Secondary | ICD-10-CM | POA: Diagnosis not present

## 2018-07-11 DIAGNOSIS — E119 Type 2 diabetes mellitus without complications: Secondary | ICD-10-CM | POA: Diagnosis not present

## 2018-07-15 DIAGNOSIS — E1140 Type 2 diabetes mellitus with diabetic neuropathy, unspecified: Secondary | ICD-10-CM | POA: Diagnosis not present

## 2018-07-15 DIAGNOSIS — E1159 Type 2 diabetes mellitus with other circulatory complications: Secondary | ICD-10-CM | POA: Diagnosis not present

## 2018-07-15 DIAGNOSIS — H81393 Other peripheral vertigo, bilateral: Secondary | ICD-10-CM | POA: Diagnosis not present

## 2018-08-07 DIAGNOSIS — R972 Elevated prostate specific antigen [PSA]: Secondary | ICD-10-CM | POA: Diagnosis not present

## 2018-08-08 DIAGNOSIS — E7800 Pure hypercholesterolemia, unspecified: Secondary | ICD-10-CM | POA: Diagnosis not present

## 2018-08-08 DIAGNOSIS — E119 Type 2 diabetes mellitus without complications: Secondary | ICD-10-CM | POA: Diagnosis not present

## 2018-08-12 DIAGNOSIS — Z6826 Body mass index (BMI) 26.0-26.9, adult: Secondary | ICD-10-CM | POA: Diagnosis not present

## 2018-08-12 DIAGNOSIS — Z299 Encounter for prophylactic measures, unspecified: Secondary | ICD-10-CM | POA: Diagnosis not present

## 2018-08-12 DIAGNOSIS — R42 Dizziness and giddiness: Secondary | ICD-10-CM | POA: Diagnosis not present

## 2018-08-12 DIAGNOSIS — I10 Essential (primary) hypertension: Secondary | ICD-10-CM | POA: Diagnosis not present

## 2018-08-15 ENCOUNTER — Ambulatory Visit (INDEPENDENT_AMBULATORY_CARE_PROVIDER_SITE_OTHER): Payer: Medicare Other | Admitting: Urology

## 2018-08-15 DIAGNOSIS — R972 Elevated prostate specific antigen [PSA]: Secondary | ICD-10-CM

## 2018-08-15 DIAGNOSIS — N401 Enlarged prostate with lower urinary tract symptoms: Secondary | ICD-10-CM

## 2018-08-15 DIAGNOSIS — R351 Nocturia: Secondary | ICD-10-CM

## 2018-08-15 DIAGNOSIS — N3281 Overactive bladder: Secondary | ICD-10-CM | POA: Diagnosis not present

## 2018-09-02 DIAGNOSIS — Z9621 Cochlear implant status: Secondary | ICD-10-CM | POA: Diagnosis not present

## 2018-09-02 DIAGNOSIS — I679 Cerebrovascular disease, unspecified: Secondary | ICD-10-CM | POA: Diagnosis not present

## 2018-09-02 DIAGNOSIS — H7012 Chronic mastoiditis, left ear: Secondary | ICD-10-CM | POA: Diagnosis not present

## 2018-09-02 DIAGNOSIS — H9190 Unspecified hearing loss, unspecified ear: Secondary | ICD-10-CM | POA: Diagnosis not present

## 2018-09-02 DIAGNOSIS — Z87891 Personal history of nicotine dependence: Secondary | ICD-10-CM | POA: Diagnosis not present

## 2018-09-02 DIAGNOSIS — Z8673 Personal history of transient ischemic attack (TIA), and cerebral infarction without residual deficits: Secondary | ICD-10-CM | POA: Diagnosis not present

## 2018-09-02 DIAGNOSIS — H9193 Unspecified hearing loss, bilateral: Secondary | ICD-10-CM | POA: Diagnosis not present

## 2018-09-16 DIAGNOSIS — H903 Sensorineural hearing loss, bilateral: Secondary | ICD-10-CM | POA: Diagnosis not present

## 2018-09-16 DIAGNOSIS — Z45321 Encounter for adjustment and management of cochlear device: Secondary | ICD-10-CM | POA: Diagnosis not present

## 2018-10-01 DIAGNOSIS — N183 Chronic kidney disease, stage 3 (moderate): Secondary | ICD-10-CM | POA: Diagnosis not present

## 2018-10-01 DIAGNOSIS — E1122 Type 2 diabetes mellitus with diabetic chronic kidney disease: Secondary | ICD-10-CM | POA: Diagnosis not present

## 2018-10-01 DIAGNOSIS — Z6825 Body mass index (BMI) 25.0-25.9, adult: Secondary | ICD-10-CM | POA: Diagnosis not present

## 2018-10-01 DIAGNOSIS — I10 Essential (primary) hypertension: Secondary | ICD-10-CM | POA: Diagnosis not present

## 2018-10-01 DIAGNOSIS — E1165 Type 2 diabetes mellitus with hyperglycemia: Secondary | ICD-10-CM | POA: Diagnosis not present

## 2018-10-01 DIAGNOSIS — Z299 Encounter for prophylactic measures, unspecified: Secondary | ICD-10-CM | POA: Diagnosis not present

## 2018-10-14 DIAGNOSIS — E119 Type 2 diabetes mellitus without complications: Secondary | ICD-10-CM | POA: Diagnosis not present

## 2018-10-14 DIAGNOSIS — E7800 Pure hypercholesterolemia, unspecified: Secondary | ICD-10-CM | POA: Diagnosis not present

## 2018-11-10 DIAGNOSIS — E7800 Pure hypercholesterolemia, unspecified: Secondary | ICD-10-CM | POA: Diagnosis not present

## 2018-11-10 DIAGNOSIS — E119 Type 2 diabetes mellitus without complications: Secondary | ICD-10-CM | POA: Diagnosis not present

## 2018-12-09 DIAGNOSIS — E119 Type 2 diabetes mellitus without complications: Secondary | ICD-10-CM | POA: Diagnosis not present

## 2018-12-09 DIAGNOSIS — E7800 Pure hypercholesterolemia, unspecified: Secondary | ICD-10-CM | POA: Diagnosis not present

## 2019-01-20 DIAGNOSIS — E7800 Pure hypercholesterolemia, unspecified: Secondary | ICD-10-CM | POA: Diagnosis not present

## 2019-01-20 DIAGNOSIS — E119 Type 2 diabetes mellitus without complications: Secondary | ICD-10-CM | POA: Diagnosis not present

## 2019-01-29 DIAGNOSIS — E119 Type 2 diabetes mellitus without complications: Secondary | ICD-10-CM | POA: Diagnosis not present

## 2019-01-29 DIAGNOSIS — H47012 Ischemic optic neuropathy, left eye: Secondary | ICD-10-CM | POA: Diagnosis not present

## 2019-01-29 DIAGNOSIS — T66XXXA Radiation sickness, unspecified, initial encounter: Secondary | ICD-10-CM | POA: Diagnosis not present

## 2019-01-29 DIAGNOSIS — Z961 Presence of intraocular lens: Secondary | ICD-10-CM | POA: Diagnosis not present

## 2019-01-29 DIAGNOSIS — C6932 Malignant neoplasm of left choroid: Secondary | ICD-10-CM | POA: Diagnosis not present

## 2019-01-29 DIAGNOSIS — H3589 Other specified retinal disorders: Secondary | ICD-10-CM | POA: Diagnosis not present

## 2019-02-12 DIAGNOSIS — I10 Essential (primary) hypertension: Secondary | ICD-10-CM | POA: Diagnosis not present

## 2019-02-12 DIAGNOSIS — R5383 Other fatigue: Secondary | ICD-10-CM | POA: Diagnosis not present

## 2019-02-12 DIAGNOSIS — Z7189 Other specified counseling: Secondary | ICD-10-CM | POA: Diagnosis not present

## 2019-02-12 DIAGNOSIS — Z79899 Other long term (current) drug therapy: Secondary | ICD-10-CM | POA: Diagnosis not present

## 2019-02-12 DIAGNOSIS — Z6824 Body mass index (BMI) 24.0-24.9, adult: Secondary | ICD-10-CM | POA: Diagnosis not present

## 2019-02-12 DIAGNOSIS — E7800 Pure hypercholesterolemia, unspecified: Secondary | ICD-10-CM | POA: Diagnosis not present

## 2019-02-12 DIAGNOSIS — Z299 Encounter for prophylactic measures, unspecified: Secondary | ICD-10-CM | POA: Diagnosis not present

## 2019-02-12 DIAGNOSIS — Z1339 Encounter for screening examination for other mental health and behavioral disorders: Secondary | ICD-10-CM | POA: Diagnosis not present

## 2019-02-12 DIAGNOSIS — Z1331 Encounter for screening for depression: Secondary | ICD-10-CM | POA: Diagnosis not present

## 2019-02-12 DIAGNOSIS — Z125 Encounter for screening for malignant neoplasm of prostate: Secondary | ICD-10-CM | POA: Diagnosis not present

## 2019-02-12 DIAGNOSIS — Z1211 Encounter for screening for malignant neoplasm of colon: Secondary | ICD-10-CM | POA: Diagnosis not present

## 2019-02-12 DIAGNOSIS — N183 Chronic kidney disease, stage 3 (moderate): Secondary | ICD-10-CM | POA: Diagnosis not present

## 2019-02-12 DIAGNOSIS — Z0000 Encounter for general adult medical examination without abnormal findings: Secondary | ICD-10-CM | POA: Diagnosis not present

## 2019-02-12 DIAGNOSIS — E1122 Type 2 diabetes mellitus with diabetic chronic kidney disease: Secondary | ICD-10-CM | POA: Diagnosis not present

## 2019-02-25 DIAGNOSIS — E7800 Pure hypercholesterolemia, unspecified: Secondary | ICD-10-CM | POA: Diagnosis not present

## 2019-02-25 DIAGNOSIS — E119 Type 2 diabetes mellitus without complications: Secondary | ICD-10-CM | POA: Diagnosis not present

## 2019-04-03 DIAGNOSIS — E119 Type 2 diabetes mellitus without complications: Secondary | ICD-10-CM | POA: Diagnosis not present

## 2019-04-03 DIAGNOSIS — E7800 Pure hypercholesterolemia, unspecified: Secondary | ICD-10-CM | POA: Diagnosis not present

## 2019-05-01 DIAGNOSIS — H60312 Diffuse otitis externa, left ear: Secondary | ICD-10-CM | POA: Diagnosis not present

## 2019-05-01 DIAGNOSIS — Z9621 Cochlear implant status: Secondary | ICD-10-CM | POA: Diagnosis not present

## 2019-05-01 DIAGNOSIS — H6123 Impacted cerumen, bilateral: Secondary | ICD-10-CM | POA: Diagnosis not present

## 2019-05-01 DIAGNOSIS — T162XXD Foreign body in left ear, subsequent encounter: Secondary | ICD-10-CM | POA: Diagnosis not present

## 2019-05-08 DIAGNOSIS — H52203 Unspecified astigmatism, bilateral: Secondary | ICD-10-CM | POA: Diagnosis not present

## 2019-05-08 DIAGNOSIS — T2632XA Burns of other specified parts of left eye and adnexa, initial encounter: Secondary | ICD-10-CM | POA: Diagnosis not present

## 2019-05-08 DIAGNOSIS — E119 Type 2 diabetes mellitus without complications: Secondary | ICD-10-CM | POA: Diagnosis not present

## 2019-05-08 DIAGNOSIS — Z961 Presence of intraocular lens: Secondary | ICD-10-CM | POA: Diagnosis not present

## 2019-05-19 DIAGNOSIS — E119 Type 2 diabetes mellitus without complications: Secondary | ICD-10-CM | POA: Diagnosis not present

## 2019-05-19 DIAGNOSIS — E7800 Pure hypercholesterolemia, unspecified: Secondary | ICD-10-CM | POA: Diagnosis not present

## 2019-05-21 DIAGNOSIS — N183 Chronic kidney disease, stage 3 (moderate): Secondary | ICD-10-CM | POA: Diagnosis not present

## 2019-05-21 DIAGNOSIS — I10 Essential (primary) hypertension: Secondary | ICD-10-CM | POA: Diagnosis not present

## 2019-05-21 DIAGNOSIS — Z299 Encounter for prophylactic measures, unspecified: Secondary | ICD-10-CM | POA: Diagnosis not present

## 2019-05-21 DIAGNOSIS — E1165 Type 2 diabetes mellitus with hyperglycemia: Secondary | ICD-10-CM | POA: Diagnosis not present

## 2019-05-21 DIAGNOSIS — E1122 Type 2 diabetes mellitus with diabetic chronic kidney disease: Secondary | ICD-10-CM | POA: Diagnosis not present

## 2019-05-21 DIAGNOSIS — Z6824 Body mass index (BMI) 24.0-24.9, adult: Secondary | ICD-10-CM | POA: Diagnosis not present

## 2019-05-29 DIAGNOSIS — Z885 Allergy status to narcotic agent status: Secondary | ICD-10-CM | POA: Diagnosis not present

## 2019-05-29 DIAGNOSIS — Z91041 Radiographic dye allergy status: Secondary | ICD-10-CM | POA: Diagnosis not present

## 2019-05-29 DIAGNOSIS — H903 Sensorineural hearing loss, bilateral: Secondary | ICD-10-CM | POA: Diagnosis not present

## 2019-05-29 DIAGNOSIS — H60312 Diffuse otitis externa, left ear: Secondary | ICD-10-CM | POA: Diagnosis not present

## 2019-05-29 DIAGNOSIS — Z9621 Cochlear implant status: Secondary | ICD-10-CM | POA: Diagnosis not present

## 2019-05-29 DIAGNOSIS — Z881 Allergy status to other antibiotic agents status: Secondary | ICD-10-CM | POA: Diagnosis not present

## 2019-05-29 DIAGNOSIS — Z888 Allergy status to other drugs, medicaments and biological substances status: Secondary | ICD-10-CM | POA: Diagnosis not present

## 2019-06-22 DIAGNOSIS — Z23 Encounter for immunization: Secondary | ICD-10-CM | POA: Diagnosis not present

## 2019-08-04 DIAGNOSIS — E119 Type 2 diabetes mellitus without complications: Secondary | ICD-10-CM | POA: Diagnosis not present

## 2019-08-04 DIAGNOSIS — E7800 Pure hypercholesterolemia, unspecified: Secondary | ICD-10-CM | POA: Diagnosis not present

## 2019-08-25 ENCOUNTER — Other Ambulatory Visit: Payer: Self-pay | Admitting: Urology

## 2019-08-26 ENCOUNTER — Other Ambulatory Visit: Payer: Self-pay | Admitting: Urology

## 2019-08-26 LAB — PSA: PSA: 1.4 ng/mL (ref <=4.0)

## 2019-09-04 ENCOUNTER — Ambulatory Visit: Payer: Medicare Other | Admitting: Urology

## 2019-09-07 DIAGNOSIS — E119 Type 2 diabetes mellitus without complications: Secondary | ICD-10-CM | POA: Diagnosis not present

## 2019-09-07 DIAGNOSIS — E7800 Pure hypercholesterolemia, unspecified: Secondary | ICD-10-CM | POA: Diagnosis not present

## 2019-09-08 DIAGNOSIS — Z23 Encounter for immunization: Secondary | ICD-10-CM | POA: Diagnosis not present

## 2019-09-15 DIAGNOSIS — E1165 Type 2 diabetes mellitus with hyperglycemia: Secondary | ICD-10-CM | POA: Diagnosis not present

## 2019-09-15 DIAGNOSIS — I10 Essential (primary) hypertension: Secondary | ICD-10-CM | POA: Diagnosis not present

## 2019-09-15 DIAGNOSIS — E1122 Type 2 diabetes mellitus with diabetic chronic kidney disease: Secondary | ICD-10-CM | POA: Diagnosis not present

## 2019-09-15 DIAGNOSIS — Z6825 Body mass index (BMI) 25.0-25.9, adult: Secondary | ICD-10-CM | POA: Diagnosis not present

## 2019-09-15 DIAGNOSIS — N183 Chronic kidney disease, stage 3 unspecified: Secondary | ICD-10-CM | POA: Diagnosis not present

## 2019-09-15 DIAGNOSIS — Z299 Encounter for prophylactic measures, unspecified: Secondary | ICD-10-CM | POA: Diagnosis not present

## 2019-10-06 DIAGNOSIS — E119 Type 2 diabetes mellitus without complications: Secondary | ICD-10-CM | POA: Diagnosis not present

## 2019-10-06 DIAGNOSIS — E7800 Pure hypercholesterolemia, unspecified: Secondary | ICD-10-CM | POA: Diagnosis not present

## 2019-10-19 DIAGNOSIS — Z23 Encounter for immunization: Secondary | ICD-10-CM | POA: Diagnosis not present

## 2019-10-22 NOTE — Progress Notes (Signed)
Subjective:  1. BPH with urinary obstruction   2. Elevated PSA   3. Urgency of urination   4. Urge incontinence   5. Microhematuria   6. Nocturia      I have symptoms of an enlarged prostate. HPI: Blake George is a 86 year-old male established patient who is here for symptoms of enlarged prostate.    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 He continues to have accidents if he holds it too long. The UUI continues to worsen. He still doesn't wear pads. The accidents are rare. He controls it by voiding more frequently. He has nocturia x 3-4. He has some hesitancy but has a good stream. He feels like he empties. He remains on dutasteride and Tamsulosin and his PSA is 1.4 prior to this visit which is back to his lowest level on therapy after it had risen to 2.7, but it was 7 prior to treatment. He has had no gross hematuria but he does have a history of chronic microhematuria. His IPSS is 12. He has no associated signs or symptoms.   IPSS    Row Name 10/23/19 1000         International Prostate Symptom Score   How often have you had the sensation of not emptying your bladder?  Not at All     How often have you had to urinate less than every two hours?  Less than half the time     How often have you found you stopped and started again several times when you urinated?  Less than half the time     How often have you found it difficult to postpone urination?  About half the time     How often have you had a weak urinary stream?  Less than half the time     How often have you had to strain to start urination?  Not at All     How many times did you typically get up at night to urinate?  3 Times     Total IPSS Score  12       Quality of Life due to urinary symptoms   If you were to spend the rest of your life with your urinary condition just the way it is now how would you feel about  that?  Mostly Disatisfied         ROS:  ROS:  A complete review of systems was performed.  All systems are negative except for pertinent findings as noted.   Review of Systems  All other systems reviewed and are negative.   Allergies  Allergen Reactions  . Diamode [Loperamide Hcl]     Reaction unknown  . Ivp Dye [Iodinated Diagnostic Agents]     Reaction unknown  . Morphine And Related     Reaction unknown  . Topiramate Other (See Comments)    Reaction unknown Reaction unknown  . Zetia [Ezetimibe]     "family does not remember"  . Clobetasol Propionate Rash  . Plavix [Clopidogrel Bisulfate]     Reaction unknown    Outpatient Encounter Medications as of 10/23/2019  Medication Sig  . dipyridamole-aspirin (AGGRENOX) 200-25 MG 12hr capsule Take by mouth.  . dutasteride (AVODART) 0.5 MG capsule Take by mouth.  Marland Kitchen glucose blood (TRUETRACK TEST) test strip   . metFORMIN (GLUCOPHAGE) 500 MG tablet Take by mouth.  Marland Kitchen  tamsulosin (FLOMAX) 0.4 MG CAPS capsule Take by mouth.  . dipyridamole-aspirin (AGGRENOX) 200-25 MG per 12 hr capsule Take 1 capsule by mouth 2 (two) times daily.  Marland Kitchen dipyridamole-aspirin (AGGRENOX) 200-25 MG per 12 hr capsule Take 1 capsule by mouth 2 (two) times daily.  Marland Kitchen dutasteride (AVODART) 0.5 MG capsule Take 1 capsule (0.5 mg total) by mouth every morning.  . metFORMIN (GLUCOPHAGE) 500 MG tablet Take 500 mg by mouth daily with breakfast.   . mirabegron ER (MYRBETRIQ) 25 MG TB24 tablet Take 1 tablet (25 mg total) by mouth daily.  . Multiple Vitamins-Minerals (MULTIVITAMIN PO) Take 1 tablet by mouth daily.   . tamsulosin (FLOMAX) 0.4 MG CAPS capsule Take 1 capsule (0.4 mg total) by mouth daily after supper.  . [DISCONTINUED] dutasteride (AVODART) 0.5 MG capsule Take 0.5 mg by mouth every morning.   . [DISCONTINUED] tamsulosin (FLOMAX) 0.4 MG CAPS Take 0.4 mg by mouth daily after supper.    No facility-administered encounter medications on file as of 10/23/2019.     Past Medical History:  Diagnosis Date  . Arthritis    "lower back" (06/10/2013)  . Basal cell carcinoma    Right lower leg  . Cochlear implant in place    bilaterally  . Dizziness    episodic  . Enlarged prostate   . Gait disturbance   . Headache(784.0)   . Hearing deficit    deaf Left ear  . Hearing loss    Cochlear implant  . History of blood transfusion 2001   "probably, related to abdominal OR" (06/10/2013)  . Hypertrophic acne scar    benign  . Hypertrophy (benign) of prostate    benign prostatic  . Leg lesion   . Melanoma ([place])    left retinal; "took radiation for it ~ 2 yr ago" (06/10/2013)  . Pancreatic adenoma    status post resection  . Pulmonary embolism ([place]) 2001   "after big stomach OR; in hospital for 78 days" (06/10/2013)  . Renal insufficiency    chronic  . 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 ([place])   . Squamous cell carcinoma, leg    RLE/notes 06/10/2013  . Stroke [facility]) ~ 2004; ~ 2012   auditory nerve,cerebellar artery stroke, left superior cerebellar artery; "that's when I lost my hearing" (06/10/2013)  . Type II diabetes mellitus ([place])   . Vertigo    no problem past 2 yrs    Past Surgical History:  Procedure Laterality Date  . ABDOMINAL SURGERY  2001   Partial colectomy/partial pancreatectomy / spenectomy  . CATARACT EXTRACTION Left   . COCHLEAR IMPLANT Bilateral 2006 / 2013  . COLECTOMY  2001   partial  . LESION REMOVAL Right 06/05/2013   Procedure: LESION REMOVAL RIGHT LOWER LEG ;  Surgeon: Harl Bowie, MD;  Location: [facility];  Service: General;  Laterality: Right;  . MELANOMA EXCISION Left    Resected, left retina  . PANCREATECTOMY  2001   partial  . SPLENECTOMY, TOTAL  2001    Social History   Socioeconomic History  . Marital status: Married    Spouse name: Not on file  . Number of children: Not on file  . Years of education: Not on file  . Highest education level: Not on file  Occupational History  .  Not on file  Tobacco Use  . Smoking status: Former Smoker    Packs/day: 0.12    Years: 20.00    Pack years: 2.40    Types: Cigarettes  Quit date: 05/29/1974    Years since quitting: 45.4  . Smokeless tobacco: Never Used  Substance and Sexual Activity  . Alcohol use: No  . Drug use: No  . Sexual activity: Never  Other Topics Concern  . Not on file  Social History Narrative  . Not on file   Social Determinants of Health   Financial Resource Strain:   . Difficulty of Paying Living Expenses: Not on file  Food Insecurity:   . Worried About Charity fundraiser in the Last Year: Not on file  . Ran Out of Food in the Last Year: Not on file  Transportation Needs:   . Lack of Transportation (Medical): Not on file  . Lack of Transportation (Non-Medical): Not on file  Physical Activity:   . Days of Exercise per Week: Not on file  . Minutes of Exercise per Session: Not on file  Stress:   . Feeling of Stress : Not on file  Social Connections:   . Frequency of Communication with Friends and Family: Not on file  . Frequency of Social Gatherings with Friends and Family: Not on file  . Attends Religious Services: Not on file  . Active Member of Clubs or Organizations: Not on file  . Attends Archivist Meetings: Not on file  . Marital Status: Not on file  Intimate Partner Violence:   . Fear of Current or Ex-Partner: Not on file  . Emotionally Abused: Not on file  . Physically Abused: Not on file  . Sexually Abused: Not on file    Family History  Problem Relation Age of Onset  . Heart attack Mother   . Diabetes Mother   . Stroke Father   . Heart disease Brother   . Diabetes Brother   . Cancer - Prostate Brother        Objective: Vitals:   10/23/19 1012  BP: (!) 128/55  Pulse: 69  Temp: (!) 96.6 F (35.9 C)     Physical Exam  Lab Results:  Results for orders placed or performed in visit on 10/23/19 (from the past 24 hour(s))  POCT urinalysis dipstick      Status: Abnormal   Collection Time: 10/23/19 10:32 AM  Result Value Ref Range   Color, UA yellow    Clarity, UA clear    Glucose, UA Negative Negative   Bilirubin, UA small    Ketones, UA neg    Spec Grav, UA 1.025 1.010 - 1.025   Blood, UA neg    pH, UA 5.0 5.0 - 8.0   Protein, UA Negative Negative   Urobilinogen, UA negative (A) 0.2 or 1.0 E.U./dL   Nitrite, UA neg    Leukocytes, UA Trace (A) Negative   Appearance clear    Odor      BMET No results for input(s): NA, K, CL, CO2, GLUCOSE, BUN, CREATININE, CALCIUM in the last 72 hours. PSA PSA  Date Value Ref Range Status  08/25/2019 1.4 < OR = 4.0 ng/mL Final    Comment:    The total PSA value from this assay system is  standardized against the WHO standard. The test  result will be approximately 20% lower when compared  to the equimolar-standardized total PSA (Beckman  Coulter). Comparison of serial PSA results should be  interpreted with this fact in mind. . This test was performed using the Siemens  chemiluminescent method. Values obtained from  different assay methods cannot be used interchangeably. PSA levels, regardless of value, should  not be interpreted as absolute evidence of the presence or absence of disease.    No results found for: TESTOSTERONE    Studies/Results: No results found.    Assessment & Plan: He has bothersome nocturia and urgency with BPH and BOO.  I will refill the tamsulosin and dutasteride and add Myrbetriq 25mg .  I have given him samples and reviewed the side effects.  He will return in 4 weeks with a PVR to assess response.   Elevated PSA remains well suppressed on dutasteride.  Microhematuria.   He has no blood today.    Meds ordered this encounter  Medications  . mirabegron ER (MYRBETRIQ) 25 MG TB24 tablet    Sig: Take 1 tablet (25 mg total) by mouth daily.    Dispense:  28 tablet    Refill:  0    28 sample tablets given.  . dutasteride (AVODART) 0.5 MG capsule    Sig:  Take 1 capsule (0.5 mg total) by mouth every morning.    Dispense:  90 capsule    Refill:  3  . tamsulosin (FLOMAX) 0.4 MG CAPS capsule    Sig: Take 1 capsule (0.4 mg total) by mouth daily after supper.    Dispense:  90 capsule    Refill:  3     Orders Placed This Encounter  Procedures  . Bladder scan  . POCT urinalysis dipstick      Return in about 4 weeks (around 11/20/2019) for With PVR to assess response to Myrbetriq. Marland Kitchen   CC: Glenda Chroman, MD      Irine Seal 10/23/2019

## 2019-10-23 ENCOUNTER — Other Ambulatory Visit: Payer: Self-pay

## 2019-10-23 ENCOUNTER — Encounter: Payer: Self-pay | Admitting: Urology

## 2019-10-23 ENCOUNTER — Ambulatory Visit (INDEPENDENT_AMBULATORY_CARE_PROVIDER_SITE_OTHER): Payer: Medicare Other | Admitting: Urology

## 2019-10-23 VITALS — BP 128/55 | HR 69 | Temp 96.6°F | Ht 71.0 in | Wt 175.0 lb

## 2019-10-23 DIAGNOSIS — R3129 Other microscopic hematuria: Secondary | ICD-10-CM | POA: Diagnosis not present

## 2019-10-23 DIAGNOSIS — N401 Enlarged prostate with lower urinary tract symptoms: Secondary | ICD-10-CM

## 2019-10-23 DIAGNOSIS — N3941 Urge incontinence: Secondary | ICD-10-CM | POA: Diagnosis not present

## 2019-10-23 DIAGNOSIS — N138 Other obstructive and reflux uropathy: Secondary | ICD-10-CM | POA: Diagnosis not present

## 2019-10-23 DIAGNOSIS — R3915 Urgency of urination: Secondary | ICD-10-CM

## 2019-10-23 DIAGNOSIS — R972 Elevated prostate specific antigen [PSA]: Secondary | ICD-10-CM

## 2019-10-23 DIAGNOSIS — R351 Nocturia: Secondary | ICD-10-CM

## 2019-10-23 LAB — POCT URINALYSIS DIPSTICK
Blood, UA: NEGATIVE
Glucose, UA: NEGATIVE
Ketones, UA: NEGATIVE
Nitrite, UA: NEGATIVE
Protein, UA: NEGATIVE
Spec Grav, UA: 1.025 (ref 1.010–1.025)
Urobilinogen, UA: NEGATIVE E.U./dL — AB
pH, UA: 5 (ref 5.0–8.0)

## 2019-10-23 LAB — BLADDER SCAN: Scan Result: 35.9

## 2019-10-23 MED ORDER — DUTASTERIDE 0.5 MG PO CAPS: 0.5000 mg | ORAL_CAPSULE | Freq: Every morning | ORAL | 3 refills | Status: DC

## 2019-10-23 MED ORDER — MIRABEGRON ER 25 MG PO TB24: 25.0000 mg | ORAL_TABLET | Freq: Every day | ORAL | 0 refills | Status: DC

## 2019-10-23 MED ORDER — TAMSULOSIN HCL 0.4 MG PO CAPS: 0.4000 mg | ORAL_CAPSULE | Freq: Every day | ORAL | 3 refills | Status: DC

## 2019-10-30 DIAGNOSIS — Z9621 Cochlear implant status: Secondary | ICD-10-CM | POA: Diagnosis not present

## 2019-10-30 DIAGNOSIS — H7012 Chronic mastoiditis, left ear: Secondary | ICD-10-CM | POA: Diagnosis not present

## 2019-10-30 DIAGNOSIS — H6092 Unspecified otitis externa, left ear: Secondary | ICD-10-CM | POA: Diagnosis not present

## 2019-10-30 DIAGNOSIS — Z45321 Encounter for adjustment and management of cochlear device: Secondary | ICD-10-CM | POA: Diagnosis not present

## 2019-10-30 DIAGNOSIS — H60312 Diffuse otitis externa, left ear: Secondary | ICD-10-CM | POA: Diagnosis not present

## 2019-10-30 DIAGNOSIS — H6121 Impacted cerumen, right ear: Secondary | ICD-10-CM | POA: Diagnosis not present

## 2019-11-03 DIAGNOSIS — E119 Type 2 diabetes mellitus without complications: Secondary | ICD-10-CM | POA: Diagnosis not present

## 2019-11-03 DIAGNOSIS — E7800 Pure hypercholesterolemia, unspecified: Secondary | ICD-10-CM | POA: Diagnosis not present

## 2019-11-12 DIAGNOSIS — H903 Sensorineural hearing loss, bilateral: Secondary | ICD-10-CM | POA: Diagnosis not present

## 2019-11-12 DIAGNOSIS — Z9621 Cochlear implant status: Secondary | ICD-10-CM | POA: Diagnosis not present

## 2019-11-12 DIAGNOSIS — H8302 Labyrinthitis, left ear: Secondary | ICD-10-CM | POA: Diagnosis not present

## 2019-11-12 DIAGNOSIS — H7192 Unspecified cholesteatoma, left ear: Secondary | ICD-10-CM | POA: Diagnosis not present

## 2019-11-12 DIAGNOSIS — H7012 Chronic mastoiditis, left ear: Secondary | ICD-10-CM | POA: Diagnosis not present

## 2019-11-23 DIAGNOSIS — H903 Sensorineural hearing loss, bilateral: Secondary | ICD-10-CM | POA: Diagnosis not present

## 2019-11-25 DIAGNOSIS — Z01812 Encounter for preprocedural laboratory examination: Secondary | ICD-10-CM | POA: Diagnosis not present

## 2019-11-25 DIAGNOSIS — Z20822 Contact with and (suspected) exposure to covid-19: Secondary | ICD-10-CM | POA: Diagnosis not present

## 2019-11-25 DIAGNOSIS — I4510 Unspecified right bundle-branch block: Secondary | ICD-10-CM | POA: Diagnosis not present

## 2019-11-25 DIAGNOSIS — T8579XA Infection and inflammatory reaction due to other internal prosthetic devices, implants and grafts, initial encounter: Secondary | ICD-10-CM | POA: Diagnosis not present

## 2019-11-25 DIAGNOSIS — E119 Type 2 diabetes mellitus without complications: Secondary | ICD-10-CM | POA: Diagnosis not present

## 2019-11-26 DIAGNOSIS — E875 Hyperkalemia: Secondary | ICD-10-CM | POA: Diagnosis not present

## 2019-12-01 DIAGNOSIS — Z85828 Personal history of other malignant neoplasm of skin: Secondary | ICD-10-CM | POA: Diagnosis not present

## 2019-12-01 DIAGNOSIS — Z8673 Personal history of transient ischemic attack (TIA), and cerebral infarction without residual deficits: Secondary | ICD-10-CM | POA: Diagnosis not present

## 2019-12-01 DIAGNOSIS — E1122 Type 2 diabetes mellitus with diabetic chronic kidney disease: Secondary | ICD-10-CM | POA: Diagnosis not present

## 2019-12-01 DIAGNOSIS — H7012 Chronic mastoiditis, left ear: Secondary | ICD-10-CM | POA: Diagnosis not present

## 2019-12-01 DIAGNOSIS — E119 Type 2 diabetes mellitus without complications: Secondary | ICD-10-CM | POA: Diagnosis not present

## 2019-12-01 DIAGNOSIS — H74392 Other acquired abnormalities of left ear ossicles: Secondary | ICD-10-CM | POA: Diagnosis not present

## 2019-12-01 DIAGNOSIS — T8579XA Infection and inflammatory reaction due to other internal prosthetic devices, implants and grafts, initial encounter: Secondary | ICD-10-CM | POA: Diagnosis not present

## 2019-12-01 DIAGNOSIS — H903 Sensorineural hearing loss, bilateral: Secondary | ICD-10-CM | POA: Diagnosis not present

## 2019-12-01 DIAGNOSIS — Z87891 Personal history of nicotine dependence: Secondary | ICD-10-CM | POA: Diagnosis not present

## 2019-12-01 DIAGNOSIS — N189 Chronic kidney disease, unspecified: Secondary | ICD-10-CM | POA: Diagnosis not present

## 2019-12-01 DIAGNOSIS — H70002 Acute mastoiditis without complications, left ear: Secondary | ICD-10-CM | POA: Diagnosis not present

## 2019-12-01 HISTORY — PX: COCHLEAR IMPLANT REMOVAL: SHX1365

## 2019-12-02 DIAGNOSIS — H7012 Chronic mastoiditis, left ear: Secondary | ICD-10-CM | POA: Diagnosis not present

## 2019-12-02 DIAGNOSIS — E119 Type 2 diabetes mellitus without complications: Secondary | ICD-10-CM | POA: Diagnosis not present

## 2019-12-02 DIAGNOSIS — E1122 Type 2 diabetes mellitus with diabetic chronic kidney disease: Secondary | ICD-10-CM | POA: Diagnosis not present

## 2019-12-02 DIAGNOSIS — H903 Sensorineural hearing loss, bilateral: Secondary | ICD-10-CM | POA: Diagnosis not present

## 2019-12-02 DIAGNOSIS — Z85828 Personal history of other malignant neoplasm of skin: Secondary | ICD-10-CM | POA: Diagnosis not present

## 2019-12-02 DIAGNOSIS — Z8673 Personal history of transient ischemic attack (TIA), and cerebral infarction without residual deficits: Secondary | ICD-10-CM | POA: Diagnosis not present

## 2019-12-03 DIAGNOSIS — E7800 Pure hypercholesterolemia, unspecified: Secondary | ICD-10-CM | POA: Diagnosis not present

## 2019-12-03 DIAGNOSIS — E119 Type 2 diabetes mellitus without complications: Secondary | ICD-10-CM | POA: Diagnosis not present

## 2019-12-03 NOTE — Progress Notes (Deleted)
Subjective:  No diagnosis found.   I have symptoms of an enlarged prostate. HPI: Blake George is a 86 year-old male established patient who is here for symptoms of enlarged prostate.    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 He continues to have accidents if he holds it too long. The UUI continues to worsen. He still doesn't wear pads. The accidents are rare. He controls it by voiding more frequently. He has nocturia x 3-4. He has some hesitancy but has a good stream. He feels like he empties. He remains on dutasteride and Tamsulosin and his PSA is 1.4 prior to this visit which is back to his lowest level on therapy after it had risen to 2.7, but it was 7 prior to treatment. He has had no gross hematuria but he does have a history of chronic microhematuria. His IPSS is 12. He has no associated signs or symptoms.      ROS:  ROS:  A complete review of systems was performed.  All systems are negative except for pertinent findings as noted.   Review of Systems  All other systems reviewed and are negative.   Allergies  Allergen Reactions  . Diamode [Loperamide Hcl]     Reaction unknown  . Ivp Dye [Iodinated Diagnostic Agents]     Reaction unknown  . Morphine And Related     Reaction unknown  . Topiramate Other (See Comments)    Reaction unknown Reaction unknown  . Zetia [Ezetimibe]     "family does not remember"  . Clobetasol Propionate Rash  . Plavix [Clopidogrel Bisulfate]     Reaction unknown    Outpatient Encounter Medications as of 12/04/2019  Medication Sig  . dipyridamole-aspirin (AGGRENOX) 200-25 MG 12hr capsule Take by mouth.  . dipyridamole-aspirin (AGGRENOX) 200-25 MG per 12 hr capsule Take 1 capsule by mouth 2 (two) times daily.  Marland Kitchen dipyridamole-aspirin (AGGRENOX) 200-25 MG per 12 hr capsule Take 1 capsule by mouth 2 (two) times daily.  Marland Kitchen dutasteride  (AVODART) 0.5 MG capsule Take by mouth.  . dutasteride (AVODART) 0.5 MG capsule Take 1 capsule (0.5 mg total) by mouth every morning.  Marland Kitchen glucose blood (TRUETRACK TEST) test strip   . metFORMIN (GLUCOPHAGE) 500 MG tablet Take 500 mg by mouth daily with breakfast.   . metFORMIN (GLUCOPHAGE) 500 MG tablet Take by mouth.  . mirabegron ER (MYRBETRIQ) 25 MG TB24 tablet Take 1 tablet (25 mg total) by mouth daily.  . Multiple Vitamins-Minerals (MULTIVITAMIN PO) Take 1 tablet by mouth daily.   . tamsulosin (FLOMAX) 0.4 MG CAPS capsule Take by mouth.  . tamsulosin (FLOMAX) 0.4 MG CAPS capsule Take 1 capsule (0.4 mg total) by mouth daily after supper.   No facility-administered encounter medications on file as of 12/04/2019.    Past Medical History:  Diagnosis Date  . Arthritis    "lower back" (06/10/2013)  . Basal cell carcinoma    Right lower leg  . Cochlear implant in place    bilaterally  . Dizziness    episodic  . Enlarged prostate   . Gait disturbance   . Headache(784.0)   . Hearing deficit    deaf Left ear  . Hearing loss    Cochlear implant  . History of blood transfusion 2001   "probably, related to abdominal OR" (06/10/2013)  . Hypertrophic acne scar    benign  .  Hypertrophy (benign) of prostate    benign prostatic  . Leg lesion   . Melanoma ([place])    left retinal; "took radiation for it ~ 2 yr ago" (06/10/2013)  . Pancreatic adenoma    status post resection  . Pulmonary embolism ([place]) 2001   "after big stomach OR; in hospital for 78 days" (06/10/2013)  . Renal insufficiency    chronic  . 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 ([place])   . Squamous cell carcinoma, leg    RLE/notes 06/10/2013  . Stroke [facility]) ~ 2004; ~ 2012   auditory nerve,cerebellar artery stroke, left superior cerebellar artery; "that's when I lost my hearing" (06/10/2013)  . Type II diabetes mellitus ([place])   . Vertigo    no problem past 2 yrs    Past Surgical History:  Procedure Laterality  Date  . ABDOMINAL SURGERY  2001   Partial colectomy/partial pancreatectomy / spenectomy  . CATARACT EXTRACTION Left   . COCHLEAR IMPLANT Bilateral 2006 / 2013  . COLECTOMY  2001   partial  . LESION REMOVAL Right 06/05/2013   Procedure: LESION REMOVAL RIGHT LOWER LEG ;  Surgeon: Harl Bowie, MD;  Location: [facility]S;  Service: General;  Laterality: Right;  . MELANOMA EXCISION Left    Resected, left retina  . PANCREATECTOMY  2001   partial  . SPLENECTOMY, TOTAL  2001    Social History   Socioeconomic History  . Marital status: Married    Spouse name: Not on file  . Number of children: Not on file  . Years of education: Not on file  . Highest education level: Not on file  Occupational History  . Not on file  Tobacco Use  . Smoking status: Former Smoker    Packs/day: 0.12    Years: 20.00    Pack years: 2.40    Types: Cigarettes    Quit date: 05/29/1974    Years since quitting: 45.5  . Smokeless tobacco: Never Used  Substance and Sexual Activity  . Alcohol use: No  . Drug use: No  . Sexual activity: Never  Other Topics Concern  . Not on file  Social History Narrative  . Not on file   Social Determinants of Health   Financial Resource Strain:   . Difficulty of Paying Living Expenses:   Food Insecurity:   . Worried About Charity fundraiser in the Last Year:   . Arboriculturist in the Last Year:   Transportation Needs:   . Film/video editor (Medical):   Marland Kitchen Lack of Transportation (Non-Medical):   Physical Activity:   . Days of Exercise per Week:   . Minutes of Exercise per Session:   Stress:   . Feeling of Stress :   Social Connections:   . Frequency of Communication with Friends and Family:   . Frequency of Social Gatherings with Friends and Family:   . Attends Religious Services:   . Active Member of Clubs or Organizations:   . Attends Archivist Meetings:   Marland Kitchen Marital Status:   Intimate Partner Violence:   . Fear of Current or Ex-Partner:    . Emotionally Abused:   Marland Kitchen Physically Abused:   . Sexually Abused:     Family History  Problem Relation Age of Onset  . Heart attack Mother   . Diabetes Mother   . Stroke Father   . Heart disease Brother   . Diabetes Brother   . Cancer - Prostate Brother  Objective: There were no vitals filed for this visit.   Physical Exam  Lab Results:  No results found for this or any previous visit (from the past 24 hour(s)).  BMET No results for input(s): NA, K, CL, CO2, GLUCOSE, BUN, CREATININE, CALCIUM in the last 72 hours. PSA PSA  Date Value Ref Range Status  08/25/2019 1.4 < OR = 4.0 ng/mL Final    Comment:    The total PSA value from this assay system is  standardized against the WHO standard. The test  result will be approximately 20% lower when compared  to the equimolar-standardized total PSA (Beckman  Coulter). Comparison of serial PSA results should be  interpreted with this fact in mind. . This test was performed using the Siemens  chemiluminescent method. Values obtained from  different assay methods cannot be used interchangeably. PSA levels, regardless of value, should not be interpreted as absolute evidence of the presence or absence of disease.    No results found for: TESTOSTERONE    Studies/Results: No results found.    Assessment & Plan: He has bothersome nocturia and urgency with BPH and BOO.  I will refill the tamsulosin and dutasteride and add Myrbetriq 25mg .  I have given him samples and reviewed the side effects.  He will return in 4 weeks with a PVR to assess response.   Elevated PSA remains well suppressed on dutasteride.  Microhematuria.   He has no blood today.    No orders of the defined types were placed in this encounter.    No orders of the defined types were placed in this encounter.     No follow-ups on file.   CC: Glenda Chroman, MD      Irine Seal 12/03/2019

## 2019-12-04 ENCOUNTER — Ambulatory Visit: Payer: Medicare Other | Admitting: Urology

## 2019-12-04 DIAGNOSIS — N3941 Urge incontinence: Secondary | ICD-10-CM | POA: Diagnosis not present

## 2019-12-04 DIAGNOSIS — Z466 Encounter for fitting and adjustment of urinary device: Secondary | ICD-10-CM | POA: Diagnosis not present

## 2019-12-04 DIAGNOSIS — R351 Nocturia: Secondary | ICD-10-CM | POA: Insufficient documentation

## 2019-12-04 DIAGNOSIS — R338 Other retention of urine: Secondary | ICD-10-CM | POA: Diagnosis not present

## 2019-12-04 DIAGNOSIS — R3915 Urgency of urination: Secondary | ICD-10-CM | POA: Insufficient documentation

## 2019-12-04 DIAGNOSIS — N401 Enlarged prostate with lower urinary tract symptoms: Secondary | ICD-10-CM | POA: Insufficient documentation

## 2019-12-04 DIAGNOSIS — R3912 Poor urinary stream: Secondary | ICD-10-CM | POA: Insufficient documentation

## 2019-12-04 DIAGNOSIS — R972 Elevated prostate specific antigen [PSA]: Secondary | ICD-10-CM | POA: Insufficient documentation

## 2019-12-04 DIAGNOSIS — Z8042 Family history of malignant neoplasm of prostate: Secondary | ICD-10-CM | POA: Insufficient documentation

## 2019-12-10 DIAGNOSIS — Z48810 Encounter for surgical aftercare following surgery on the sense organs: Secondary | ICD-10-CM | POA: Diagnosis not present

## 2019-12-10 DIAGNOSIS — H7012 Chronic mastoiditis, left ear: Secondary | ICD-10-CM | POA: Diagnosis not present

## 2019-12-11 ENCOUNTER — Ambulatory Visit (INDEPENDENT_AMBULATORY_CARE_PROVIDER_SITE_OTHER): Payer: Medicare Other | Admitting: Urology

## 2019-12-11 ENCOUNTER — Other Ambulatory Visit: Payer: Self-pay

## 2019-12-11 VITALS — BP 119/52 | HR 60 | Temp 97.7°F | Ht 71.0 in | Wt 165.0 lb

## 2019-12-11 DIAGNOSIS — R3915 Urgency of urination: Secondary | ICD-10-CM

## 2019-12-11 LAB — POCT URINALYSIS DIPSTICK
Bilirubin, UA: NEGATIVE
Blood, UA: NEGATIVE
Glucose, UA: NEGATIVE
Ketones, UA: NEGATIVE
Leukocytes, UA: NEGATIVE
Nitrite, UA: NEGATIVE
Protein, UA: NEGATIVE
Spec Grav, UA: 1.015 (ref 1.010–1.025)
Urobilinogen, UA: 0.2 E.U./dL
pH, UA: 6 (ref 5.0–8.0)

## 2019-12-11 LAB — BLADDER SCAN AMB NON-IMAGING: Scan Result: 44.8

## 2019-12-11 NOTE — Progress Notes (Signed)
Subjective:  1. Urgency of urination      I have symptoms of an enlarged prostate. HPI: Blake George is a 86 year-old male established patient who is here for symptoms of enlarged prostate.    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   He has been on tamsulosin and dutasteride but was given Myrbetriq 25mg  at his last visit in February for increased nocturia.   He didn't take the med because he thought it might cause dizziness.   His PVR today is 45ml.  His IPSS is 6.  He was hospitalized recently for issues with his left cochlear implant that required removal.   He developed post op retention requiring a foley and saw Dr. Salomon Mast and had a voiding trial that he passed.  He still has some UUI.   He doesn't wear pads.   He has had a prior elevated PSA but it was 1.4 prior to his last visit which is back to his lowest level on therapy after it had risen to 2.7, but it was 7 prior to treatment.   He has had no gross hematuria but he does have a history of chronic microhematuria but his UA is clear today. He has no associated signs or symptoms.      ROS:  ROS:  A complete review of systems was performed.  All systems are negative except for pertinent findings as noted.   Review of Systems  HENT: Positive for congestion and hearing loss.     Allergies  Allergen Reactions  . Diamode [Loperamide Hcl]     Reaction unknown  . Ivp Dye [Iodinated Diagnostic Agents]     Reaction unknown  . Morphine And Related     Reaction unknown  . Topiramate Other (See Comments)    Reaction unknown Reaction unknown  . Zetia [Ezetimibe]     "family does not remember"  . Clobetasol Propionate Rash  . Plavix [Clopidogrel Bisulfate]     Reaction unknown    Outpatient Encounter Medications as of 12/11/2019  Medication Sig  . dipyridamole-aspirin (AGGRENOX) 200-25 MG per 12 hr capsule Take 1  capsule by mouth 2 (two) times daily.  Marland Kitchen dutasteride (AVODART) 0.5 MG capsule Take 1 capsule (0.5 mg total) by mouth every morning.  Marland Kitchen glucose blood (TRUETRACK TEST) test strip   . metFORMIN (GLUCOPHAGE) 500 MG tablet Take 500 mg by mouth daily with breakfast.   . metFORMIN (GLUCOPHAGE) 500 MG tablet Take by mouth.  . mirabegron ER (MYRBETRIQ) 25 MG TB24 tablet Take 1 tablet (25 mg total) by mouth daily.  . Multiple Vitamins-Minerals (MULTIVITAMIN PO) Take 1 tablet by mouth daily.   . tamsulosin (FLOMAX) 0.4 MG CAPS capsule Take by mouth.  . tamsulosin (FLOMAX) 0.4 MG CAPS capsule Take 1 capsule (0.4 mg total) by mouth daily after supper.  . [DISCONTINUED] dipyridamole-aspirin (AGGRENOX) 200-25 MG 12hr capsule Take by mouth.  . [DISCONTINUED] dipyridamole-aspirin (AGGRENOX) 200-25 MG per 12 hr capsule Take 1 capsule by mouth 2 (two) times daily.  . [DISCONTINUED] dutasteride (AVODART) 0.5 MG capsule Take by mouth.   No facility-administered encounter medications on file as of 12/11/2019.    Past Medical History:  Diagnosis Date  . Arthritis    "lower back" (06/10/2013)  . Basal cell carcinoma    Right lower leg  . Cochlear implant in place    bilaterally  .  Dizziness    episodic  . Enlarged prostate   . Gait disturbance   . Headache(784.0)   . Hearing deficit    deaf Left ear  . Hearing loss    Cochlear implant  . History of blood transfusion 2001   "probably, related to abdominal OR" (06/10/2013)  . Hypertrophic acne scar    benign  . Hypertrophy (benign) of prostate    benign prostatic  . Leg lesion   . Melanoma ([place])    left retinal; "took radiation for it ~ 2 yr ago" (06/10/2013)  . Pancreatic adenoma    status post resection  . Pulmonary embolism ([place]) 2001   "after big stomach OR; in hospital for 78 days" (06/10/2013)  . Renal insufficiency    chronic  . 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 ([place])   . Squamous cell carcinoma, leg    RLE/notes  06/10/2013  . [facility] ~ [facility]; ~ 2012   auditory nerve,cerebellar artery stroke, left superior cerebellar artery; "that's when I lost my hearing" (06/10/2013)  . Type II diabetes mellitus ([place])   . Vertigo    no problem past 2 yrs    Past Surgical History:  Procedure Laterality Date  . ABDOMINAL SURGERY  2001   Partial colectomy/partial pancreatectomy / spenectomy  . CATARACT EXTRACTION Left   . COCHLEAR IMPLANT Bilateral 2006 / 2013  . COLECTOMY  2001   partial  . LESION REMOVAL Right 06/05/2013   Procedure: LESION REMOVAL RIGHT LOWER LEG ;  Surgeon: Harl Bowie, MD;  Location: [facility];  Service: General;  Laterality: Right;  . MELANOMA EXCISION Left    Resected, left retina  . PANCREATECTOMY  2001   partial  . SPLENECTOMY, TOTAL  2001    Social History   Socioeconomic History  . Marital status: Married    Spouse name: Not on file  . Number of children: Not on file  . Years of education: Not on file  . Highest education level: Not on file  Occupational History  . Not on file  Tobacco Use  . Smoking status: Former Smoker    Packs/day: 0.12    Years: 20.00    Pack years: 2.40    Types: Cigarettes    Quit date: 05/29/1974    Years since quitting: 45.5  . Smokeless tobacco: Never Used  Substance and Sexual Activity  . Alcohol use: No  . Drug use: No  . Sexual activity: Never  Other Topics Concern  . Not on file  Social History Narrative  . Not on file   Social Determinants of Health   Financial Resource Strain:   . Difficulty of Paying Living Expenses:   Food Insecurity:   . Worried About Charity fundraiser in the Last Year:   . Arboriculturist in the Last Year:   Transportation Needs:   . Film/video editor (Medical):   Marland Kitchen Lack of Transportation (Non-Medical):   Physical Activity:   . Days of Exercise per Week:   . Minutes of Exercise per Session:   Stress:   . Feeling of Stress :   Social Connections:   . Frequency of Communication with  Friends and Family:   . Frequency of Social Gatherings with Friends and Family:   . Attends Religious Services:   . Active Member of Clubs or Organizations:   . Attends Archivist Meetings:   Marland Kitchen Marital Status:   Intimate Partner Violence:   . Fear of  Current or Ex-Partner:   . Emotionally Abused:   Marland Kitchen Physically Abused:   . Sexually Abused:     Family History  Problem Relation Age of Onset  . Heart attack Mother   . Diabetes Mother   . Stroke Father   . Heart disease Brother   . Diabetes Brother   . Cancer - Prostate Brother        Objective: Vitals:   12/11/19 1413  Temp: 97.7 F (36.5 C)     Physical Exam Vitals reviewed.  Constitutional:      Appearance: Normal appearance.  Neurological:     Mental Status: He is alert.     Lab Results:  No results found for this or any previous visit (from the past 24 hour(s)).  BMET No results for input(s): NA, K, CL, CO2, GLUCOSE, BUN, CREATININE, CALCIUM in the last 72 hours. PSA PSA  Date Value Ref Range Status  08/25/2019 1.4 < OR = 4.0 ng/mL Final    Comment:    The total PSA value from this assay system is  standardized against the WHO standard. The test  result will be approximately 20% lower when compared  to the equimolar-standardized total PSA (Beckman  Coulter). Comparison of serial PSA results should be  interpreted with this fact in mind. . This test was performed using the Siemens  chemiluminescent method. Values obtained from  different assay methods cannot be used interchangeably. PSA levels, regardless of value, should not be interpreted as absolute evidence of the presence or absence of disease.    No results found for: TESTOSTERONE    Studies/Results: I have reviewed recent notes from [facility].  His PVR is 65ml.     Assessment & Plan: He has bothersome nocturia and urgency with BPH and BOO. He will remains on tamsulosin and dutasteride.  Hd didn't take the Myrbetriq 25mg   and with recent retention, it is best for him not to at this time.   He will return in 6 months.    Elevated PSA remains well suppressed on dutasteride.  Microhematuria.   UA clear today,.  No orders of the defined types were placed in this encounter.    Orders Placed This Encounter  Procedures  . POCT urinalysis dipstick  . BLADDER SCAN AMB NON-IMAGING      No follow-ups on file.   CC: Glenda Chroman, MD      Irine Seal 12/11/2019

## 2019-12-22 DIAGNOSIS — N183 Chronic kidney disease, stage 3 unspecified: Secondary | ICD-10-CM | POA: Diagnosis not present

## 2019-12-22 DIAGNOSIS — I10 Essential (primary) hypertension: Secondary | ICD-10-CM | POA: Diagnosis not present

## 2019-12-22 DIAGNOSIS — E1165 Type 2 diabetes mellitus with hyperglycemia: Secondary | ICD-10-CM | POA: Diagnosis not present

## 2019-12-22 DIAGNOSIS — E1122 Type 2 diabetes mellitus with diabetic chronic kidney disease: Secondary | ICD-10-CM | POA: Diagnosis not present

## 2019-12-22 DIAGNOSIS — Z299 Encounter for prophylactic measures, unspecified: Secondary | ICD-10-CM | POA: Diagnosis not present

## 2019-12-31 DIAGNOSIS — H7012 Chronic mastoiditis, left ear: Secondary | ICD-10-CM | POA: Diagnosis not present

## 2020-01-14 DIAGNOSIS — H7012 Chronic mastoiditis, left ear: Secondary | ICD-10-CM | POA: Diagnosis not present

## 2020-01-24 DIAGNOSIS — E7800 Pure hypercholesterolemia, unspecified: Secondary | ICD-10-CM | POA: Diagnosis not present

## 2020-01-24 DIAGNOSIS — E119 Type 2 diabetes mellitus without complications: Secondary | ICD-10-CM | POA: Diagnosis not present

## 2020-01-27 DIAGNOSIS — H7012 Chronic mastoiditis, left ear: Secondary | ICD-10-CM | POA: Diagnosis not present

## 2020-02-04 DIAGNOSIS — E119 Type 2 diabetes mellitus without complications: Secondary | ICD-10-CM | POA: Diagnosis not present

## 2020-02-04 DIAGNOSIS — H47012 Ischemic optic neuropathy, left eye: Secondary | ICD-10-CM | POA: Diagnosis not present

## 2020-02-04 DIAGNOSIS — T66XXXA Radiation sickness, unspecified, initial encounter: Secondary | ICD-10-CM | POA: Diagnosis not present

## 2020-02-04 DIAGNOSIS — C6932 Malignant neoplasm of left choroid: Secondary | ICD-10-CM | POA: Diagnosis not present

## 2020-02-04 DIAGNOSIS — H3589 Other specified retinal disorders: Secondary | ICD-10-CM | POA: Diagnosis not present

## 2020-02-04 DIAGNOSIS — Z961 Presence of intraocular lens: Secondary | ICD-10-CM | POA: Diagnosis not present

## 2020-02-24 DIAGNOSIS — E7800 Pure hypercholesterolemia, unspecified: Secondary | ICD-10-CM | POA: Diagnosis not present

## 2020-02-24 DIAGNOSIS — E119 Type 2 diabetes mellitus without complications: Secondary | ICD-10-CM | POA: Diagnosis not present

## 2020-03-08 DIAGNOSIS — Z9621 Cochlear implant status: Secondary | ICD-10-CM | POA: Diagnosis not present

## 2020-03-08 DIAGNOSIS — I10 Essential (primary) hypertension: Secondary | ICD-10-CM | POA: Diagnosis not present

## 2020-03-08 DIAGNOSIS — H6642 Suppurative otitis media, unspecified, left ear: Secondary | ICD-10-CM | POA: Diagnosis not present

## 2020-03-08 DIAGNOSIS — N183 Chronic kidney disease, stage 3 unspecified: Secondary | ICD-10-CM | POA: Diagnosis not present

## 2020-03-08 DIAGNOSIS — Z299 Encounter for prophylactic measures, unspecified: Secondary | ICD-10-CM | POA: Diagnosis not present

## 2020-03-09 DIAGNOSIS — N183 Chronic kidney disease, stage 3 unspecified: Secondary | ICD-10-CM | POA: Diagnosis not present

## 2020-03-09 DIAGNOSIS — H6642 Suppurative otitis media, unspecified, left ear: Secondary | ICD-10-CM | POA: Diagnosis not present

## 2020-03-09 DIAGNOSIS — Z299 Encounter for prophylactic measures, unspecified: Secondary | ICD-10-CM | POA: Diagnosis not present

## 2020-03-09 DIAGNOSIS — I10 Essential (primary) hypertension: Secondary | ICD-10-CM | POA: Diagnosis not present

## 2020-03-09 DIAGNOSIS — E7800 Pure hypercholesterolemia, unspecified: Secondary | ICD-10-CM | POA: Diagnosis not present

## 2020-03-09 DIAGNOSIS — E1165 Type 2 diabetes mellitus with hyperglycemia: Secondary | ICD-10-CM | POA: Diagnosis not present

## 2020-03-10 DIAGNOSIS — N183 Chronic kidney disease, stage 3 unspecified: Secondary | ICD-10-CM | POA: Diagnosis not present

## 2020-03-10 DIAGNOSIS — Z299 Encounter for prophylactic measures, unspecified: Secondary | ICD-10-CM | POA: Diagnosis not present

## 2020-03-10 DIAGNOSIS — H6642 Suppurative otitis media, unspecified, left ear: Secondary | ICD-10-CM | POA: Diagnosis not present

## 2020-03-10 DIAGNOSIS — J309 Allergic rhinitis, unspecified: Secondary | ICD-10-CM | POA: Diagnosis not present

## 2020-03-10 DIAGNOSIS — I10 Essential (primary) hypertension: Secondary | ICD-10-CM | POA: Diagnosis not present

## 2020-03-11 DIAGNOSIS — Z9621 Cochlear implant status: Secondary | ICD-10-CM | POA: Diagnosis not present

## 2020-03-11 DIAGNOSIS — H903 Sensorineural hearing loss, bilateral: Secondary | ICD-10-CM | POA: Diagnosis not present

## 2020-03-11 DIAGNOSIS — H95192 Other disorders following mastoidectomy, left ear: Secondary | ICD-10-CM | POA: Diagnosis not present

## 2020-03-16 DIAGNOSIS — E7800 Pure hypercholesterolemia, unspecified: Secondary | ICD-10-CM | POA: Diagnosis not present

## 2020-03-16 DIAGNOSIS — Z6824 Body mass index (BMI) 24.0-24.9, adult: Secondary | ICD-10-CM | POA: Diagnosis not present

## 2020-03-16 DIAGNOSIS — Z79899 Other long term (current) drug therapy: Secondary | ICD-10-CM | POA: Diagnosis not present

## 2020-03-16 DIAGNOSIS — Z0000 Encounter for general adult medical examination without abnormal findings: Secondary | ICD-10-CM | POA: Diagnosis not present

## 2020-03-16 DIAGNOSIS — Z1211 Encounter for screening for malignant neoplasm of colon: Secondary | ICD-10-CM | POA: Diagnosis not present

## 2020-03-16 DIAGNOSIS — I10 Essential (primary) hypertension: Secondary | ICD-10-CM | POA: Diagnosis not present

## 2020-03-16 DIAGNOSIS — Z1339 Encounter for screening examination for other mental health and behavioral disorders: Secondary | ICD-10-CM | POA: Diagnosis not present

## 2020-03-16 DIAGNOSIS — E1165 Type 2 diabetes mellitus with hyperglycemia: Secondary | ICD-10-CM | POA: Diagnosis not present

## 2020-03-16 DIAGNOSIS — R5383 Other fatigue: Secondary | ICD-10-CM | POA: Diagnosis not present

## 2020-03-16 DIAGNOSIS — Z1331 Encounter for screening for depression: Secondary | ICD-10-CM | POA: Diagnosis not present

## 2020-03-16 DIAGNOSIS — Z299 Encounter for prophylactic measures, unspecified: Secondary | ICD-10-CM | POA: Diagnosis not present

## 2020-03-16 DIAGNOSIS — Z7189 Other specified counseling: Secondary | ICD-10-CM | POA: Diagnosis not present

## 2020-03-25 DIAGNOSIS — H903 Sensorineural hearing loss, bilateral: Secondary | ICD-10-CM | POA: Diagnosis not present

## 2020-03-25 DIAGNOSIS — E7800 Pure hypercholesterolemia, unspecified: Secondary | ICD-10-CM | POA: Diagnosis not present

## 2020-03-25 DIAGNOSIS — Z9089 Acquired absence of other organs: Secondary | ICD-10-CM | POA: Diagnosis not present

## 2020-03-25 DIAGNOSIS — Z9621 Cochlear implant status: Secondary | ICD-10-CM | POA: Diagnosis not present

## 2020-03-25 DIAGNOSIS — E119 Type 2 diabetes mellitus without complications: Secondary | ICD-10-CM | POA: Diagnosis not present

## 2020-03-25 DIAGNOSIS — H95192 Other disorders following mastoidectomy, left ear: Secondary | ICD-10-CM | POA: Diagnosis not present

## 2020-04-08 DIAGNOSIS — H903 Sensorineural hearing loss, bilateral: Secondary | ICD-10-CM | POA: Diagnosis not present

## 2020-04-08 DIAGNOSIS — H95192 Other disorders following mastoidectomy, left ear: Secondary | ICD-10-CM | POA: Diagnosis not present

## 2020-04-08 DIAGNOSIS — Z9621 Cochlear implant status: Secondary | ICD-10-CM | POA: Diagnosis not present

## 2020-04-08 DIAGNOSIS — Z9089 Acquired absence of other organs: Secondary | ICD-10-CM | POA: Diagnosis not present

## 2020-04-21 DIAGNOSIS — Z299 Encounter for prophylactic measures, unspecified: Secondary | ICD-10-CM | POA: Diagnosis not present

## 2020-04-21 DIAGNOSIS — I10 Essential (primary) hypertension: Secondary | ICD-10-CM | POA: Diagnosis not present

## 2020-04-21 DIAGNOSIS — E1122 Type 2 diabetes mellitus with diabetic chronic kidney disease: Secondary | ICD-10-CM | POA: Diagnosis not present

## 2020-04-21 DIAGNOSIS — N183 Chronic kidney disease, stage 3 unspecified: Secondary | ICD-10-CM | POA: Diagnosis not present

## 2020-04-21 DIAGNOSIS — E1165 Type 2 diabetes mellitus with hyperglycemia: Secondary | ICD-10-CM | POA: Diagnosis not present

## 2020-04-22 DIAGNOSIS — E7800 Pure hypercholesterolemia, unspecified: Secondary | ICD-10-CM | POA: Diagnosis not present

## 2020-04-22 DIAGNOSIS — E119 Type 2 diabetes mellitus without complications: Secondary | ICD-10-CM | POA: Diagnosis not present

## 2020-05-06 DIAGNOSIS — H95192 Other disorders following mastoidectomy, left ear: Secondary | ICD-10-CM | POA: Diagnosis not present

## 2020-05-06 DIAGNOSIS — H7012 Chronic mastoiditis, left ear: Secondary | ICD-10-CM | POA: Diagnosis not present

## 2020-05-06 DIAGNOSIS — Z4889 Encounter for other specified surgical aftercare: Secondary | ICD-10-CM | POA: Diagnosis not present

## 2020-05-06 DIAGNOSIS — E119 Type 2 diabetes mellitus without complications: Secondary | ICD-10-CM | POA: Diagnosis not present

## 2020-05-06 DIAGNOSIS — Z9621 Cochlear implant status: Secondary | ICD-10-CM | POA: Diagnosis not present

## 2020-05-06 DIAGNOSIS — Z9089 Acquired absence of other organs: Secondary | ICD-10-CM | POA: Diagnosis not present

## 2020-05-06 DIAGNOSIS — H903 Sensorineural hearing loss, bilateral: Secondary | ICD-10-CM | POA: Diagnosis not present

## 2020-05-09 DIAGNOSIS — R5383 Other fatigue: Secondary | ICD-10-CM | POA: Diagnosis not present

## 2020-05-09 DIAGNOSIS — I10 Essential (primary) hypertension: Secondary | ICD-10-CM | POA: Diagnosis not present

## 2020-05-09 DIAGNOSIS — Z299 Encounter for prophylactic measures, unspecified: Secondary | ICD-10-CM | POA: Diagnosis not present

## 2020-05-09 DIAGNOSIS — E538 Deficiency of other specified B group vitamins: Secondary | ICD-10-CM | POA: Diagnosis not present

## 2020-05-09 DIAGNOSIS — N183 Chronic kidney disease, stage 3 unspecified: Secondary | ICD-10-CM | POA: Diagnosis not present

## 2020-05-09 DIAGNOSIS — E559 Vitamin D deficiency, unspecified: Secondary | ICD-10-CM | POA: Diagnosis not present

## 2020-05-12 DIAGNOSIS — E875 Hyperkalemia: Secondary | ICD-10-CM | POA: Diagnosis not present

## 2020-05-13 DIAGNOSIS — E1165 Type 2 diabetes mellitus with hyperglycemia: Secondary | ICD-10-CM | POA: Diagnosis not present

## 2020-05-13 DIAGNOSIS — H26491 Other secondary cataract, right eye: Secondary | ICD-10-CM | POA: Diagnosis not present

## 2020-05-13 DIAGNOSIS — I10 Essential (primary) hypertension: Secondary | ICD-10-CM | POA: Diagnosis not present

## 2020-05-13 DIAGNOSIS — T2632XD Burns of other specified parts of left eye and adnexa, subsequent encounter: Secondary | ICD-10-CM | POA: Diagnosis not present

## 2020-05-13 DIAGNOSIS — N183 Chronic kidney disease, stage 3 unspecified: Secondary | ICD-10-CM | POA: Diagnosis not present

## 2020-05-13 DIAGNOSIS — H524 Presbyopia: Secondary | ICD-10-CM | POA: Diagnosis not present

## 2020-05-13 DIAGNOSIS — Z299 Encounter for prophylactic measures, unspecified: Secondary | ICD-10-CM | POA: Diagnosis not present

## 2020-05-13 DIAGNOSIS — E119 Type 2 diabetes mellitus without complications: Secondary | ICD-10-CM | POA: Diagnosis not present

## 2020-05-13 DIAGNOSIS — E1122 Type 2 diabetes mellitus with diabetic chronic kidney disease: Secondary | ICD-10-CM | POA: Diagnosis not present

## 2020-05-20 DIAGNOSIS — E119 Type 2 diabetes mellitus without complications: Secondary | ICD-10-CM | POA: Diagnosis not present

## 2020-05-20 DIAGNOSIS — H95192 Other disorders following mastoidectomy, left ear: Secondary | ICD-10-CM | POA: Diagnosis not present

## 2020-05-20 DIAGNOSIS — Z9621 Cochlear implant status: Secondary | ICD-10-CM | POA: Diagnosis not present

## 2020-05-20 DIAGNOSIS — Z8619 Personal history of other infectious and parasitic diseases: Secondary | ICD-10-CM | POA: Diagnosis not present

## 2020-05-20 DIAGNOSIS — Z7984 Long term (current) use of oral hypoglycemic drugs: Secondary | ICD-10-CM | POA: Diagnosis not present

## 2020-05-20 DIAGNOSIS — H903 Sensorineural hearing loss, bilateral: Secondary | ICD-10-CM | POA: Diagnosis not present

## 2020-05-26 DIAGNOSIS — E7800 Pure hypercholesterolemia, unspecified: Secondary | ICD-10-CM | POA: Diagnosis not present

## 2020-05-26 DIAGNOSIS — E119 Type 2 diabetes mellitus without complications: Secondary | ICD-10-CM | POA: Diagnosis not present

## 2020-06-10 ENCOUNTER — Ambulatory Visit: Payer: Medicare Other | Admitting: Urology

## 2020-06-10 DIAGNOSIS — Z888 Allergy status to other drugs, medicaments and biological substances status: Secondary | ICD-10-CM | POA: Diagnosis not present

## 2020-06-10 DIAGNOSIS — Z9089 Acquired absence of other organs: Secondary | ICD-10-CM | POA: Diagnosis not present

## 2020-06-10 DIAGNOSIS — R269 Unspecified abnormalities of gait and mobility: Secondary | ICD-10-CM | POA: Diagnosis not present

## 2020-06-10 DIAGNOSIS — H903 Sensorineural hearing loss, bilateral: Secondary | ICD-10-CM | POA: Diagnosis not present

## 2020-06-10 DIAGNOSIS — Z885 Allergy status to narcotic agent status: Secondary | ICD-10-CM | POA: Diagnosis not present

## 2020-06-10 DIAGNOSIS — Z881 Allergy status to other antibiotic agents status: Secondary | ICD-10-CM | POA: Diagnosis not present

## 2020-06-10 DIAGNOSIS — Z9621 Cochlear implant status: Secondary | ICD-10-CM | POA: Diagnosis not present

## 2020-06-23 NOTE — Progress Notes (Signed)
Subjective:  1. Urge incontinence   2. BPH with urinary obstruction   3. Nocturia   4. Elevated PSA      I have symptoms of an enlarged prostate. HPI: Blake George is a 86 year-old male established patient who is here for symptoms of enlarged prostate.    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   He has been on tamsulosin and dutasteride but was given Myrbetriq 25mg  at his last visit in February for increased nocturia.   He didn't take the med because he thought it might cause dizziness.   His PVR today is 62ml.  His IPSS is 11.    He developed post op retention requiring a foley and saw Dr. Salomon George and had a voiding trial that he passed.  He still has some UUI.   He doesn't wear pads.   He has had a prior elevated PSA but it was 1.4 prior to his last visit which is back to his lowest level on therapy after it had risen to 2.7, but it was 7 prior to treatment.   He has had no gross hematuria but he does have a history of chronic microhematuria but his UA is clear today. He has no associated signs or symptoms.    IPSS    Row Name 06/24/20 1400         International Prostate Symptom Score   How often have you had the sensation of not emptying your bladder? Less than half the time     How often have you had to urinate less than every two hours? Less than 1 in 5 times     How often have you found you stopped and started again several times when you urinated? Less than 1 in 5 times     How often have you found it difficult to postpone urination? About half the time     How often have you had a weak urinary stream? Less than 1 in 5 times     How often have you had to strain to start urination? Not at All     How many times did you typically get up at night to urinate? 4 Times     Total IPSS Score 12       Quality of Life due to urinary symptoms   If you were to spend the rest  of your life with your urinary condition just the way it is now how would you feel about that? Mostly Disatisfied             ROS:  ROS:  A complete review of systems was performed.  All systems are negative except for pertinent findings as noted.   Review of Systems    Allergies  Allergen Reactions  . Diamode [Loperamide Hcl]     Reaction unknown  . Ivp Dye [Iodinated Diagnostic Agents]     Reaction unknown  . Morphine And Related     Reaction unknown  . Topiramate Other (See Comments)    Reaction unknown Reaction unknown  . Zetia [Ezetimibe]     "family does not remember"  . Bacitra-Neomycin-Polymyxin-Hc Anxiety    Had anxiety and insomnia with cortisporin otic drops.   . Clobetasol Propionate Rash  . Ofloxacin Anxiety    Caused anxiety and insomnia  . Plavix [Clopidogrel Bisulfate]     Reaction unknown  Outpatient Encounter Medications as of 06/24/2020  Medication Sig  . cyanocobalamin 1000 MCG tablet Take by mouth.  . dipyridamole-aspirin (AGGRENOX) 200-25 MG per 12 hr capsule Take 1 capsule by mouth 2 (two) times daily.  Marland Kitchen dutasteride (AVODART) 0.5 MG capsule Take 1 capsule (0.5 mg total) by mouth every morning.  Marland Kitchen glucose blood (TRUETRACK TEST) test strip   . lisinopril (ZESTRIL) 2.5 MG tablet Take 2.5 mg by mouth daily.  . metFORMIN (GLUCOPHAGE) 500 MG tablet Take by mouth.  . Multiple Vitamins-Minerals (MULTIVITAMIN PO) Take 1 tablet by mouth daily.   . tamsulosin (FLOMAX) 0.4 MG CAPS capsule Take 1 capsule (0.4 mg total) by mouth daily after supper.  . [DISCONTINUED] dutasteride (AVODART) 0.5 MG capsule Take 1 capsule (0.5 mg total) by mouth every morning.  . [DISCONTINUED] mirabegron ER (MYRBETRIQ) 25 MG TB24 tablet Take 1 tablet (25 mg total) by mouth daily.  . [DISCONTINUED] tamsulosin (FLOMAX) 0.4 MG CAPS capsule Take 1 capsule (0.4 mg total) by mouth daily after supper.  . [DISCONTINUED] acetaminophen (TYLENOL) 325 MG tablet Take by mouth. (Patient  not taking: Reported on 06/24/2020)  . [DISCONTINUED] cefPROZIL (CEFZIL) 500 MG tablet Take 500 mg by mouth 2 (two) times daily. (Patient not taking: Reported on 06/24/2020)   No facility-administered encounter medications on file as of 06/24/2020.    Past Medical History:  Diagnosis Date  . Arthritis    "lower back" (06/10/2013)  . Basal cell carcinoma    Right lower leg  . Cochlear implant in place    bilaterally  . Dizziness    episodic  . Enlarged prostate   . Gait disturbance   . Headache(784.0)   . Hearing deficit    deaf Left ear  . Hearing loss    Cochlear implant  . History of blood transfusion 2001   "probably, related to abdominal OR" (06/10/2013)  . Hypertrophic acne scar    benign  . Hypertrophy (benign) of prostate    benign prostatic  . Leg lesion   . 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 Pancreatic adenoma    status post resection  . Pulmonary embolism ([place]) 2001   "after big stomach OR; in hospital for 78 days" (06/10/2013)  . Renal insufficiency    chronic  . 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 Squamous cell carcinoma, leg    RLE/notes 06/10/2013  . Stroke [facility]) ~ 2004; ~ 2012   auditory nerve,cerebellar artery stroke, left superior cerebellar artery; "that's when I lost my hearing" (06/10/2013)  . Type II diabetes mellitus ([place])   . Vertigo    no problem past 2 yrs    Past Surgical History:  Procedure Laterality Date  . ABDOMINAL SURGERY  2001   Partial colectomy/partial pancreatectomy / spenectomy  . CATARACT EXTRACTION Left   . COCHLEAR IMPLANT Bilateral 2006 / 2013  . COCHLEAR IMPLANT REMOVAL Left 12/01/2019  . COLECTOMY  2001   partial  . LESION REMOVAL Right 06/05/2013   Procedure: LESION REMOVAL RIGHT LOWER LEG ;  Surgeon: Harl Bowie, MD;  Location: [facility];  Service: General;  Laterality: Right;  . MELANOMA EXCISION Left    Resected, left retina  .  PANCREATECTOMY  2001   partial  . SPLENECTOMY, TOTAL  2001    Social History   Socioeconomic History  . Marital status: Married    Spouse name: Not on file  . Number  of children: Not on file  . Years of education: Not on file  . Highest education level: Not on file  Occupational History  . Not on file  Tobacco Use  . Smoking status: Former Smoker    Packs/day: 0.12    Years: 20.00    Pack years: 2.40    Types: Cigarettes    Quit date: 05/29/1974    Years since quitting: 46.1  . Smokeless tobacco: Never Used  Substance and Sexual Activity  . Alcohol use: No  . Drug use: No  . Sexual activity: Never  Other Topics Concern  . Not on file  Social History Narrative  . Not on file   Social Determinants of Health   Financial Resource Strain:   . Difficulty of Paying Living Expenses: Not on file  Food Insecurity:   . Worried About Charity fundraiser in the Last Year: Not on file  . Ran Out of Food in the Last Year: Not on file  Transportation Needs:   . Lack of Transportation (Medical): Not on file  . Lack of Transportation (Non-Medical): Not on file  Physical Activity:   . Days of Exercise per Week: Not on file  . Minutes of Exercise per Session: Not on file  Stress:   . Feeling of Stress : Not on file  Social Connections:   . Frequency of Communication with Friends and Family: Not on file  . Frequency of Social Gatherings with Friends and Family: Not on file  . Attends Religious Services: Not on file  . Active Member of Clubs or Organizations: Not on file  . Attends Archivist Meetings: Not on file  . Marital Status: Not on file  Intimate Partner Violence:   . Fear of Current or Ex-Partner: Not on file  . Emotionally Abused: Not on file  . Physically Abused: Not on file  . Sexually Abused: Not on file    Family History  Problem Relation Age of Onset  . Heart attack Mother   . Diabetes Mother   . Stroke Father   . Heart disease Brother   .  Diabetes Brother   . Cancer - Prostate Brother        Objective: Vitals:   06/24/20 1408  BP: (!) 108/56  Pulse: 68  Temp: 98.6 F (37 C)     Physical Exam Vitals reviewed.  Constitutional:      Appearance: Normal appearance.  Neurological:     Mental Status: He is alert.     Lab Results:  No results found for this or any previous visit (from the past 24 hour(s)).  BMET No results for input(s): NA, K, CL, CO2, GLUCOSE, BUN, CREATININE, CALCIUM in the last 72 hours. PSA PSA  Date Value Ref Range Status  08/25/2019 1.4 < OR = 4.0 ng/mL Final    Comment:    The total PSA value from this assay system is  standardized against the WHO standard. The test  result will be approximately 20% lower when compared  to the equimolar-standardized total PSA (Beckman  Coulter). Comparison of serial PSA results should be  interpreted with this fact in mind. . This test was performed using the Siemens  chemiluminescent method. Values obtained from  different assay methods cannot be used interchangeably. PSA levels, regardless of value, should not be interpreted as absolute evidence of the presence or absence of disease.    No results found for: TESTOSTERONE    Studies/Results: UA is clear.  Assessment & Plan: He has stable  nocturia and urgency with BPH and BOO. He will remains on tamsulosin and dutasteride.        Elevated PSA was well suppressed on dutasteride at last check in 12/20.  I will get a PSA in 6 months and no more if that remains stable.   Microhematuria.   UA clear today,.   Meds ordered this encounter  Medications  . dutasteride (AVODART) 0.5 MG capsule    Sig: Take 1 capsule (0.5 mg total) by mouth every morning.    Dispense:  90 capsule    Refill:  3  . tamsulosin (FLOMAX) 0.4 MG CAPS capsule    Sig: Take 1 capsule (0.4 mg total) by mouth daily after supper.    Dispense:  90 capsule    Refill:  3     Orders Placed This Encounter   Procedures  . PSA, total and free    Standing Status:   Future    Standing Expiration Date:   06/24/2021  . Bladder Scan (Post Void Residual) in office      Return in about 6 months (around 12/23/2020) for PVR.   Marland Kitchen   CC: Glenda Chroman, MD      Irine Seal 06/24/2020

## 2020-06-24 ENCOUNTER — Encounter: Payer: Self-pay | Admitting: Urology

## 2020-06-24 ENCOUNTER — Ambulatory Visit (INDEPENDENT_AMBULATORY_CARE_PROVIDER_SITE_OTHER): Payer: Medicare Other | Admitting: Urology

## 2020-06-24 ENCOUNTER — Other Ambulatory Visit: Payer: Self-pay

## 2020-06-24 VITALS — BP 108/56 | HR 68 | Temp 98.6°F | Ht 71.0 in | Wt 165.0 lb

## 2020-06-24 DIAGNOSIS — R972 Elevated prostate specific antigen [PSA]: Secondary | ICD-10-CM | POA: Diagnosis not present

## 2020-06-24 DIAGNOSIS — N401 Enlarged prostate with lower urinary tract symptoms: Secondary | ICD-10-CM

## 2020-06-24 DIAGNOSIS — N3941 Urge incontinence: Secondary | ICD-10-CM

## 2020-06-24 DIAGNOSIS — R351 Nocturia: Secondary | ICD-10-CM

## 2020-06-24 DIAGNOSIS — E7800 Pure hypercholesterolemia, unspecified: Secondary | ICD-10-CM | POA: Diagnosis not present

## 2020-06-24 DIAGNOSIS — E119 Type 2 diabetes mellitus without complications: Secondary | ICD-10-CM | POA: Diagnosis not present

## 2020-06-24 DIAGNOSIS — N138 Other obstructive and reflux uropathy: Secondary | ICD-10-CM

## 2020-06-24 LAB — BLADDER SCAN AMB NON-IMAGING: Scan Result: 39

## 2020-06-24 MED ORDER — TAMSULOSIN HCL 0.4 MG PO CAPS: 0.4000 mg | ORAL_CAPSULE | Freq: Every day | ORAL | 3 refills | Status: DC

## 2020-06-24 MED ORDER — DUTASTERIDE 0.5 MG PO CAPS: 0.5000 mg | ORAL_CAPSULE | Freq: Every morning | ORAL | 3 refills | Status: DC

## 2020-06-24 NOTE — Progress Notes (Signed)
Urological Symptom Review  Patient is experiencing the following symptoms: Frequent urination Hard to postpone urination Get up at night to urinate Stream starts and stops   Review of Systems  Gastrointestinal (upper)  : Negative for upper GI symptoms  Gastrointestinal (lower) : Negative for lower GI symptoms  Constitutional : Fatigue  Skin: Negative for skin symptoms  Eyes: Negative for eye symptoms  Ear/Nose/Throat : Sinus problems  Hematologic/Lymphatic: Easy bruising  Cardiovascular : Negative for cardiovascular symptoms  Respiratory : Negative for respiratory symptoms  Endocrine: Negative for endocrine symptoms  Musculoskeletal: Back pain  Neurological: Negative for neurological symptoms  Psychologic: Negative for psychiatric symptoms

## 2020-06-28 DIAGNOSIS — Z23 Encounter for immunization: Secondary | ICD-10-CM | POA: Diagnosis not present

## 2020-07-01 DIAGNOSIS — Z885 Allergy status to narcotic agent status: Secondary | ICD-10-CM | POA: Diagnosis not present

## 2020-07-01 DIAGNOSIS — Z9089 Acquired absence of other organs: Secondary | ICD-10-CM | POA: Diagnosis not present

## 2020-07-01 DIAGNOSIS — H95192 Other disorders following mastoidectomy, left ear: Secondary | ICD-10-CM | POA: Diagnosis not present

## 2020-07-01 DIAGNOSIS — Z881 Allergy status to other antibiotic agents status: Secondary | ICD-10-CM | POA: Diagnosis not present

## 2020-07-01 DIAGNOSIS — Z888 Allergy status to other drugs, medicaments and biological substances status: Secondary | ICD-10-CM | POA: Diagnosis not present

## 2020-07-01 DIAGNOSIS — Z79899 Other long term (current) drug therapy: Secondary | ICD-10-CM | POA: Diagnosis not present

## 2020-07-01 DIAGNOSIS — Z9621 Cochlear implant status: Secondary | ICD-10-CM | POA: Diagnosis not present

## 2020-07-01 DIAGNOSIS — T8141XA Infection following a procedure, superficial incisional surgical site, initial encounter: Secondary | ICD-10-CM | POA: Diagnosis not present

## 2020-07-19 DIAGNOSIS — Z23 Encounter for immunization: Secondary | ICD-10-CM | POA: Diagnosis not present

## 2020-07-26 DIAGNOSIS — E7800 Pure hypercholesterolemia, unspecified: Secondary | ICD-10-CM | POA: Diagnosis not present

## 2020-07-26 DIAGNOSIS — E119 Type 2 diabetes mellitus without complications: Secondary | ICD-10-CM | POA: Diagnosis not present

## 2020-07-28 DIAGNOSIS — I10 Essential (primary) hypertension: Secondary | ICD-10-CM | POA: Diagnosis not present

## 2020-07-28 DIAGNOSIS — Z299 Encounter for prophylactic measures, unspecified: Secondary | ICD-10-CM | POA: Diagnosis not present

## 2020-07-28 DIAGNOSIS — J069 Acute upper respiratory infection, unspecified: Secondary | ICD-10-CM | POA: Diagnosis not present

## 2020-07-28 DIAGNOSIS — H109 Unspecified conjunctivitis: Secondary | ICD-10-CM | POA: Diagnosis not present

## 2020-07-28 DIAGNOSIS — N183 Chronic kidney disease, stage 3 unspecified: Secondary | ICD-10-CM | POA: Diagnosis not present

## 2020-07-29 DIAGNOSIS — I10 Essential (primary) hypertension: Secondary | ICD-10-CM | POA: Diagnosis not present

## 2020-07-29 DIAGNOSIS — Z299 Encounter for prophylactic measures, unspecified: Secondary | ICD-10-CM | POA: Diagnosis not present

## 2020-07-29 DIAGNOSIS — D692 Other nonthrombocytopenic purpura: Secondary | ICD-10-CM | POA: Diagnosis not present

## 2020-07-29 DIAGNOSIS — N183 Chronic kidney disease, stage 3 unspecified: Secondary | ICD-10-CM | POA: Diagnosis not present

## 2020-07-29 DIAGNOSIS — J069 Acute upper respiratory infection, unspecified: Secondary | ICD-10-CM | POA: Diagnosis not present

## 2020-08-02 DIAGNOSIS — Z299 Encounter for prophylactic measures, unspecified: Secondary | ICD-10-CM | POA: Diagnosis not present

## 2020-08-02 DIAGNOSIS — E1122 Type 2 diabetes mellitus with diabetic chronic kidney disease: Secondary | ICD-10-CM | POA: Diagnosis not present

## 2020-08-02 DIAGNOSIS — E1165 Type 2 diabetes mellitus with hyperglycemia: Secondary | ICD-10-CM | POA: Diagnosis not present

## 2020-08-02 DIAGNOSIS — I10 Essential (primary) hypertension: Secondary | ICD-10-CM | POA: Diagnosis not present

## 2020-08-02 DIAGNOSIS — N183 Chronic kidney disease, stage 3 unspecified: Secondary | ICD-10-CM | POA: Diagnosis not present

## 2020-08-11 DIAGNOSIS — Z91041 Radiographic dye allergy status: Secondary | ICD-10-CM | POA: Diagnosis not present

## 2020-08-11 DIAGNOSIS — Z881 Allergy status to other antibiotic agents status: Secondary | ICD-10-CM | POA: Diagnosis not present

## 2020-08-11 DIAGNOSIS — E119 Type 2 diabetes mellitus without complications: Secondary | ICD-10-CM | POA: Diagnosis not present

## 2020-08-11 DIAGNOSIS — Z45321 Encounter for adjustment and management of cochlear device: Secondary | ICD-10-CM | POA: Diagnosis not present

## 2020-08-11 DIAGNOSIS — H903 Sensorineural hearing loss, bilateral: Secondary | ICD-10-CM | POA: Diagnosis not present

## 2020-08-11 DIAGNOSIS — H95192 Other disorders following mastoidectomy, left ear: Secondary | ICD-10-CM | POA: Diagnosis not present

## 2020-08-11 DIAGNOSIS — Z9621 Cochlear implant status: Secondary | ICD-10-CM | POA: Diagnosis not present

## 2020-08-11 DIAGNOSIS — Z7984 Long term (current) use of oral hypoglycemic drugs: Secondary | ICD-10-CM | POA: Diagnosis not present

## 2020-08-11 DIAGNOSIS — Z885 Allergy status to narcotic agent status: Secondary | ICD-10-CM | POA: Diagnosis not present

## 2020-08-11 DIAGNOSIS — Z888 Allergy status to other drugs, medicaments and biological substances status: Secondary | ICD-10-CM | POA: Diagnosis not present

## 2020-08-11 DIAGNOSIS — Z9089 Acquired absence of other organs: Secondary | ICD-10-CM | POA: Diagnosis not present

## 2020-08-25 DIAGNOSIS — E7800 Pure hypercholesterolemia, unspecified: Secondary | ICD-10-CM | POA: Diagnosis not present

## 2020-08-25 DIAGNOSIS — E119 Type 2 diabetes mellitus without complications: Secondary | ICD-10-CM | POA: Diagnosis not present

## 2020-09-22 DIAGNOSIS — Z299 Encounter for prophylactic measures, unspecified: Secondary | ICD-10-CM | POA: Diagnosis not present

## 2020-09-22 DIAGNOSIS — E1165 Type 2 diabetes mellitus with hyperglycemia: Secondary | ICD-10-CM | POA: Diagnosis not present

## 2020-09-22 DIAGNOSIS — G459 Transient cerebral ischemic attack, unspecified: Secondary | ICD-10-CM | POA: Diagnosis not present

## 2020-09-22 DIAGNOSIS — R0989 Other specified symptoms and signs involving the circulatory and respiratory systems: Secondary | ICD-10-CM | POA: Diagnosis not present

## 2020-09-22 DIAGNOSIS — T8141XD Infection following a procedure, superficial incisional surgical site, subsequent encounter: Secondary | ICD-10-CM | POA: Diagnosis not present

## 2020-09-22 DIAGNOSIS — I10 Essential (primary) hypertension: Secondary | ICD-10-CM | POA: Diagnosis not present

## 2020-09-22 DIAGNOSIS — Z9621 Cochlear implant status: Secondary | ICD-10-CM | POA: Diagnosis not present

## 2020-09-22 DIAGNOSIS — Z9089 Acquired absence of other organs: Secondary | ICD-10-CM | POA: Diagnosis not present

## 2020-09-22 DIAGNOSIS — H95192 Other disorders following mastoidectomy, left ear: Secondary | ICD-10-CM | POA: Diagnosis not present

## 2020-09-22 DIAGNOSIS — H903 Sensorineural hearing loss, bilateral: Secondary | ICD-10-CM | POA: Diagnosis not present

## 2020-09-22 DIAGNOSIS — N183 Chronic kidney disease, stage 3 unspecified: Secondary | ICD-10-CM | POA: Diagnosis not present

## 2020-09-29 ENCOUNTER — Encounter: Payer: Self-pay | Admitting: Neurology

## 2020-09-29 ENCOUNTER — Telehealth: Payer: Self-pay | Admitting: Neurology

## 2020-09-29 ENCOUNTER — Ambulatory Visit (INDEPENDENT_AMBULATORY_CARE_PROVIDER_SITE_OTHER): Payer: Medicare Other | Admitting: Neurology

## 2020-09-29 VITALS — BP 108/50 | HR 56 | Ht 72.0 in | Wt 167.6 lb

## 2020-09-29 DIAGNOSIS — E538 Deficiency of other specified B group vitamins: Secondary | ICD-10-CM

## 2020-09-29 DIAGNOSIS — R441 Visual hallucinations: Secondary | ICD-10-CM | POA: Diagnosis not present

## 2020-09-29 DIAGNOSIS — R269 Unspecified abnormalities of gait and mobility: Secondary | ICD-10-CM

## 2020-09-29 DIAGNOSIS — I679 Cerebrovascular disease, unspecified: Secondary | ICD-10-CM

## 2020-09-29 DIAGNOSIS — G459 Transient cerebral ischemic attack, unspecified: Secondary | ICD-10-CM

## 2020-09-29 HISTORY — DX: Visual hallucinations: R44.1

## 2020-09-29 NOTE — Telephone Encounter (Signed)
Medicare/bcbs supp order sent to GI. No auth they will reach out to the patient to schedule.  

## 2020-09-29 NOTE — Progress Notes (Signed)
Reason for visit: Possible TIA  Referring physician: Dr. Vickey Huger is a 87 y.o. male  History of present illness:  Mr. Zeman is a 87 year old left-handed white male with a history of cerebrovascular disease, diabetes, and prior retinal melanoma status post radiation therapy.  The patient has cochlear implants.  He also has a history of renal impairment and an allergy to IVP dye.  The patient comes to this office for evaluation of an event that occurred around 19 September 2020.  The patient was at home with his wife.  He was watching TV and noted that things did not look normal on the TV, but he has a lot of difficulty describing what he saw.  It seems as if the images on the TV were moving about.  The patient got up and went into the kitchen and ate lunch, after lunch he got up and had similar problems, he had to lean over a countertop, but he denied total visual loss or problems with feeling dizzy or woozy.  He was not diaphoretic according to the wife.  He went back and sat in the living room, the wife indicates that he was slightly confused, and gradually improved with his mental status over the next hour.  The patient has chronic gait instability, he may fall on occasion but he has not hit his head.  He has a history of cerebrovascular disease and is on Aggrenox on a chronic basis.  The patient denied any chest pain or shortness of breath or palpitations of the heart associated with the above event.  He has had 2 other episodes of generalized weakness without the visual disturbance.  His wife has not checked blood pressures or blood sugars during the events.  The patient has been using a walker for ambulation for least a year.  The patient denied any focal numbness or weakness of the face, arms, legs with the above event, he was not slurring his speech or had any problems with aphasia.  He comes to this office for further evaluation.  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  Over the last several weeks he has had several episodes of visual hallucinations, perception of shadows of people off to the left, when he turns his eyes to see, there is no person there.  He has recently been treated for an infection of a left cochlear implant, this has been removed.  The patient has been on no new medications around the time of the above events.  Past Medical History:  Diagnosis Date  . Arthritis    "lower back" (06/10/2013)  . Basal cell carcinoma    Right lower leg  . Cochlear implant in place    bilaterally  . Dizziness    episodic  . Enlarged prostate   . Gait disturbance   . Headache(784.0)   . Hearing deficit    deaf Left ear  . Hearing loss    Cochlear implant  . History of blood transfusion 2001   "probably, related to abdominal OR" (06/10/2013)  . Hypertrophic acne scar    benign  . Hypertrophy (benign) of prostate    benign prostatic  . Leg lesion   . 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 Pancreatic adenoma    status post resection  . Pulmonary embolism ([place]) 2001   "after big stomach OR; in hospital for 78 days" (06/10/2013)  .  Renal insufficiency    chronic  . 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 ([place])   . Squamous cell carcinoma, leg    RLE/notes 06/10/2013  . Stroke [facility]) ~ 2004; ~ 2012   auditory nerve,cerebellar artery stroke, left superior cerebellar artery; "that's when I lost my hearing" (06/10/2013)  . Type II diabetes mellitus ([place])   . Vertigo    no problem past 2 yrs    Past Surgical History:  Procedure Laterality Date  . ABDOMINAL SURGERY  2001   Partial colectomy/partial pancreatectomy / spenectomy  . CATARACT EXTRACTION Left   . COCHLEAR IMPLANT Bilateral 2006 / 2013  . COCHLEAR IMPLANT REMOVAL Left 12/01/2019  . COLECTOMY  2001   partial  . LESION REMOVAL Right 06/05/2013   Procedure: LESION REMOVAL RIGHT LOWER LEG ;  Surgeon: Harl Bowie, MD;   Location: [facility];  Service: General;  Laterality: Right;  . MELANOMA EXCISION Left    Resected, left retina  . PANCREATECTOMY  2001   partial  . SPLENECTOMY, TOTAL  2001    Family History  Problem Relation Age of Onset  . Heart attack Mother   . Diabetes Mother   . Stroke Father   . Heart disease Brother   . Diabetes Brother   . Cancer - Prostate Brother     Social history:  reports that he quit smoking about 46 years ago. His smoking use included cigarettes. He has a 2.40 pack-year smoking history. He has never used smokeless tobacco. He reports that he does not drink alcohol and does not use drugs.  Medications:  Prior to Admission medications   Medication Sig Start Date End Date Taking? Authorizing Provider  cyanocobalamin 1000 MCG tablet Take by mouth.   Yes [provider]  dipyridamole-aspirin (AGGRENOX) 200-25 MG per 12 hr capsule Take 1 capsule by mouth 2 (two) times daily. 10/29/14  Yes Marcial Pacas, MD  dutasteride (AVODART) 0.5 MG capsule Take 1 capsule (0.5 mg total) by mouth every morning. 06/24/20  Yes Irine Seal, MD  glucose blood (TRUETRACK TEST) test strip  11/25/17  Yes [provider]  metFORMIN (GLUCOPHAGE) 500 MG tablet Take by mouth. 10/03/12  Yes [provider]  Multiple Vitamins-Minerals (MULTIVITAMIN PO) Take 1 tablet by mouth daily.    Yes [provider]  tamsulosin (FLOMAX) 0.4 MG CAPS capsule Take 1 capsule (0.4 mg total) by mouth daily after supper. 06/24/20  Yes Irine Seal, MD  lisinopril (ZESTRIL) 2.5 MG tablet Take 2.5 mg by mouth daily. 01/07/20   [provider]      Allergies  Allergen Reactions  . Diamode [Loperamide Hcl]     Reaction unknown  . Ivp Dye [Iodinated Diagnostic Agents]     Reaction unknown  . Morphine And Related     Reaction unknown  . Topiramate Other (See Comments)    Reaction unknown Reaction unknown  . Zetia [Ezetimibe]     "family does not remember"  .  Bacitra-Neomycin-Polymyxin-Hc Anxiety    Had anxiety and insomnia with cortisporin otic drops.   . Clobetasol Propionate Rash  . Ofloxacin Anxiety    Caused anxiety and insomnia  . Plavix [Clopidogrel Bisulfate]     Reaction unknown    ROS:  Out of a complete 14 system review of symptoms, the patient complains only of the following symptoms, and all other reviewed systems are negative.  Generalized weakness Visual disturbance Gait problems  Blood pressure (!) 108/50, pulse (!) 56, height 6' (1.829 m),  weight 167 lb 9.6 oz (76 kg).  Physical Exam  General: The patient is alert and cooperative at the time of the examination.  Eyes: Pupils are equal, round, and reactive to light. Discs are flat bilaterally.  Neck: The neck is supple, no carotid bruits are noted.  Respiratory: The respiratory examination is clear.  Cardiovascular: The cardiovascular examination reveals a regular rate and rhythm, no obvious murmurs or rubs are noted.  Skin: Extremities are with 1+ edema at the ankles bilaterally.  Neurologic Exam  Mental status: The patient is alert and oriented x 3 at the time of the examination. The Mini-Mental status examination done today shows a total score 28/30.  Cranial nerves: Facial symmetry is present. There is good sensation of the face to pinprick and soft touch bilaterally. The strength of the facial muscles and the muscles to head turning and shoulder shrug are normal bilaterally. Speech is well enunciated, no aphasia or dysarthria is noted. Extraocular movements are full. Visual fields are full. The tongue is midline, and the patient has symmetric elevation of the soft palate. No obvious hearing deficits are noted.  Motor: The motor testing reveals 5 over 5 strength of all 4 extremities. Good symmetric motor tone is noted throughout.  Sensory: Sensory testing is intact to pinprick, soft touch, vibration sensation, and position sense on all 4 extremities, with  exception of decreased pinprick sensation on the left leg. No evidence of extinction is noted.  Coordination: Cerebellar testing reveals good finger-nose-finger and heel-to-shin bilaterally.  Gait and station: Gait is somewhat wide-based, unsteady, the patient walks with his knees flexed.  He is unable to fully straighten the knees.  Tandem gait was not attempted.  Romberg is negative.  Reflexes: Deep tendon reflexes are symmetric, but slightly depressed bilaterally. Toes are downgoing bilaterally.   Assessment/Plan:  1.  Transient episode of visual disturbance, generalized weakness  2.  Chronic gait instability  3.  Visual hallucinations  The patient will undergo blood work today.  He will have a memory test.  The wife will check a blood sugar and blood pressure if another episode occurs.  It is not clear if the above episode truly represents a TIA, but this will be evaluated.  The patient cannot have MRI and he cannot have contrast with CT.  He will have a CT scan of the brain and carotid Doppler study.  He will remain on Aggrenox.  He will follow up here in 3 to 4 months.  Jill Alexanders MD 09/29/2020 8:12 AM  [facility] [address] [place], [place] [address]  Phone [phone] Fax [phone]

## 2020-10-01 LAB — COMPREHENSIVE METABOLIC PANEL
ALT: 16 IU/L (ref 0–44)
AST: 22 IU/L (ref 0–40)
Albumin/Globulin Ratio: 1.8 (ref 1.2–2.2)
Albumin: 4.4 g/dL (ref 3.5–4.6)
Alkaline Phosphatase: 63 IU/L (ref 44–121)
BUN/Creatinine Ratio: 18 (ref 10–24)
BUN: 26 mg/dL (ref 10–36)
Bilirubin Total: 0.4 mg/dL (ref 0.0–1.2)
CO2: 24 mmol/L (ref 20–29)
Calcium: 9.7 mg/dL (ref 8.6–10.2)
Chloride: 102 mmol/L (ref 96–106)
Creatinine, Ser: 1.41 mg/dL — ABNORMAL HIGH (ref 0.76–1.27)
GFR calc Af Amer: 50 mL/min/{1.73_m2} — ABNORMAL LOW (ref >59)
GFR calc non Af Amer: 44 mL/min/{1.73_m2} — ABNORMAL LOW (ref >59)
Globulin, Total: 2.5 g/dL (ref 1.5–4.5)
Glucose: 108 mg/dL — ABNORMAL HIGH (ref 65–99)
Potassium: 4.9 mmol/L (ref 3.5–5.2)
Sodium: 139 mmol/L (ref 134–144)
Total Protein: 6.9 g/dL (ref 6.0–8.5)

## 2020-10-01 LAB — CBC WITH DIFFERENTIAL/PLATELET
Basophils Absolute: 0.1 10*3/uL (ref 0.0–0.2)
Basos: 1 %
EOS (ABSOLUTE): 0.1 10*3/uL (ref 0.0–0.4)
Eos: 2 %
Hematocrit: 37.2 % — ABNORMAL LOW (ref 37.5–51.0)
Hemoglobin: 12.5 g/dL — ABNORMAL LOW (ref 13.0–17.7)
Immature Grans (Abs): 0 10*3/uL (ref 0.0–0.1)
Immature Granulocytes: 0 %
Lymphocytes Absolute: 1 10*3/uL (ref 0.7–3.1)
Lymphs: 17 %
MCH: 32.9 pg (ref 26.6–33.0)
MCHC: 33.6 g/dL (ref 31.5–35.7)
MCV: 98 fL — ABNORMAL HIGH (ref 79–97)
Monocytes Absolute: 0.7 10*3/uL (ref 0.1–0.9)
Monocytes: 12 %
Neutrophils Absolute: 3.9 10*3/uL (ref 1.4–7.0)
Neutrophils: 68 %
Platelets: 223 10*3/uL (ref 150–450)
RBC: 3.8 x10E6/uL — ABNORMAL LOW (ref 4.14–5.80)
RDW: 12.2 % (ref 11.6–15.4)
WBC: 5.7 10*3/uL (ref 3.4–10.8)

## 2020-10-01 LAB — VITAMIN B12: Vitamin B-12: >2000 pg/mL — ABNORMAL HIGH (ref 232–1245)

## 2020-10-01 LAB — SEDIMENTATION RATE: Sed Rate: 2 mm/hr (ref 0–30)

## 2020-10-01 LAB — RPR: RPR Ser Ql: NONREACTIVE

## 2020-10-01 LAB — COPPER, SERUM: Copper: 93 ug/dL (ref 69–132)

## 2020-10-04 ENCOUNTER — Telehealth: Payer: Self-pay | Admitting: Emergency Medicine

## 2020-10-04 NOTE — Telephone Encounter (Signed)
Called and spoke to patient's wife.  Discussed Dr. Tobey Grim findings regarding patient's blood work.  Patient denied further questions, verbalized understanding and expressed appreciation for the phone call.

## 2020-10-04 NOTE — Telephone Encounter (Signed)
-----   Message from Kathrynn Ducking, MD sent at 10/01/2020  5:25 PM EST ----- Blood work reveals chronic renal insufficiency with a mild anemia associated with this. Otherwise, blood work is unremarkable. Please call the patient. ----- Message ----- From: Lavone Neri Lab Results In Sent: 09/30/2020   7:38 AM EST To: Kathrynn Ducking, MD

## 2020-10-12 ENCOUNTER — Other Ambulatory Visit: Payer: Self-pay

## 2020-10-12 ENCOUNTER — Ambulatory Visit (HOSPITAL_COMMUNITY)
Admission: RE | Admit: 2020-10-12 | Discharge: 2020-10-12 | Disposition: A | Discharge status: Home / Self Care | Payer: Medicare Other | Source: Ambulatory Visit | Attending: Neurology | Admitting: Neurology

## 2020-10-12 ENCOUNTER — Telehealth: Payer: Self-pay | Admitting: Neurology

## 2020-10-12 DIAGNOSIS — G459 Transient cerebral ischemic attack, unspecified: Secondary | ICD-10-CM | POA: Diagnosis not present

## 2020-10-12 NOTE — Telephone Encounter (Signed)
I called the patient, talk with the wife.  Carotid Doppler study is unremarkable, CT of the head will be done in 2 days.  Carotid doppler 10/12/20:  Summary: Right Carotid: The extracranial vessels were near-normal with only minimal wall                thickening or plaque.  Left Carotid: Velocities in the left ICA are consistent with a 1-39% stenosis.  Vertebrals: Bilateral vertebral arteries demonstrate antegrade flow.

## 2020-10-12 NOTE — CV Procedure (Signed)
Carotid duplex has been completed.  Results can be found under chart review under CV PROC. 10/12/2020 11:53 AM Nimco Bivens RVT, RDMS

## 2020-10-14 ENCOUNTER — Ambulatory Visit
Admission: RE | Admit: 2020-10-14 | Discharge: 2020-10-14 | Disposition: A | Discharge status: Home / Self Care | Payer: Medicare Other | Source: Ambulatory Visit | Attending: Neurology | Admitting: Neurology

## 2020-10-14 DIAGNOSIS — G459 Transient cerebral ischemic attack, unspecified: Secondary | ICD-10-CM

## 2020-10-16 ENCOUNTER — Telehealth: Payer: Self-pay | Admitting: Neurology

## 2020-10-16 NOTE — Telephone Encounter (Signed)
  I called the patient and talk with the wife.  The CT of the head does not show evidence of an overt new stroke.  The carotid Doppler studies were unremarkable.  Is not clear what occurred with the visual changes that the patient experienced, if this occurs again the wife will check blood pressure and blood sugar to make sure that a nonvascular etiology is not the culprit.  CT head 10/15/20:  IMPRESSION: This CT scan of the head without contrast shows the following: 1.   No acute findings. 2.   Mild generalized cortical atrophy typical for age but progressed compared to the 2014 CT scan.  Minimal stable chronic microvascular ischemic changes. 3.   Cochlear implant on the right.  Since 2014 hardware on the left has been removed

## 2020-10-31 DIAGNOSIS — H903 Sensorineural hearing loss, bilateral: Secondary | ICD-10-CM | POA: Diagnosis not present

## 2020-11-01 DIAGNOSIS — I10 Essential (primary) hypertension: Secondary | ICD-10-CM | POA: Diagnosis not present

## 2020-11-01 DIAGNOSIS — N183 Chronic kidney disease, stage 3 unspecified: Secondary | ICD-10-CM | POA: Diagnosis not present

## 2020-11-01 DIAGNOSIS — Z299 Encounter for prophylactic measures, unspecified: Secondary | ICD-10-CM | POA: Diagnosis not present

## 2020-11-01 DIAGNOSIS — E1122 Type 2 diabetes mellitus with diabetic chronic kidney disease: Secondary | ICD-10-CM | POA: Diagnosis not present

## 2020-11-01 DIAGNOSIS — E1165 Type 2 diabetes mellitus with hyperglycemia: Secondary | ICD-10-CM | POA: Diagnosis not present

## 2020-11-03 DIAGNOSIS — Z9089 Acquired absence of other organs: Secondary | ICD-10-CM | POA: Diagnosis not present

## 2020-11-03 DIAGNOSIS — H903 Sensorineural hearing loss, bilateral: Secondary | ICD-10-CM | POA: Diagnosis not present

## 2020-11-03 DIAGNOSIS — E119 Type 2 diabetes mellitus without complications: Secondary | ICD-10-CM | POA: Diagnosis not present

## 2020-11-03 DIAGNOSIS — H95192 Other disorders following mastoidectomy, left ear: Secondary | ICD-10-CM | POA: Diagnosis not present

## 2020-11-03 DIAGNOSIS — Z9621 Cochlear implant status: Secondary | ICD-10-CM | POA: Diagnosis not present

## 2020-12-22 ENCOUNTER — Ambulatory Visit: Payer: Medicare Other | Admitting: Urology

## 2020-12-22 DIAGNOSIS — H6121 Impacted cerumen, right ear: Secondary | ICD-10-CM | POA: Diagnosis not present

## 2020-12-22 DIAGNOSIS — H95192 Other disorders following mastoidectomy, left ear: Secondary | ICD-10-CM | POA: Diagnosis not present

## 2020-12-22 DIAGNOSIS — Z9621 Cochlear implant status: Secondary | ICD-10-CM | POA: Diagnosis not present

## 2020-12-22 DIAGNOSIS — Z9089 Acquired absence of other organs: Secondary | ICD-10-CM | POA: Diagnosis not present

## 2020-12-22 DIAGNOSIS — H903 Sensorineural hearing loss, bilateral: Secondary | ICD-10-CM | POA: Diagnosis not present

## 2021-01-17 DIAGNOSIS — J069 Acute upper respiratory infection, unspecified: Secondary | ICD-10-CM | POA: Diagnosis not present

## 2021-01-17 DIAGNOSIS — I10 Essential (primary) hypertension: Secondary | ICD-10-CM | POA: Diagnosis not present

## 2021-01-17 DIAGNOSIS — Z299 Encounter for prophylactic measures, unspecified: Secondary | ICD-10-CM | POA: Diagnosis not present

## 2021-01-17 DIAGNOSIS — N183 Chronic kidney disease, stage 3 unspecified: Secondary | ICD-10-CM | POA: Diagnosis not present

## 2021-01-17 DIAGNOSIS — E1165 Type 2 diabetes mellitus with hyperglycemia: Secondary | ICD-10-CM | POA: Diagnosis not present

## 2021-02-02 ENCOUNTER — Ambulatory Visit (INDEPENDENT_AMBULATORY_CARE_PROVIDER_SITE_OTHER): Payer: Medicare Other | Admitting: Neurology

## 2021-02-02 ENCOUNTER — Encounter: Payer: Self-pay | Admitting: Neurology

## 2021-02-02 VITALS — BP 103/53 | HR 55 | Ht 71.0 in | Wt 172.2 lb

## 2021-02-02 DIAGNOSIS — R269 Unspecified abnormalities of gait and mobility: Secondary | ICD-10-CM

## 2021-02-02 DIAGNOSIS — I679 Cerebrovascular disease, unspecified: Secondary | ICD-10-CM | POA: Diagnosis not present

## 2021-02-02 NOTE — Progress Notes (Signed)
Reason for visit: Cerebrovascular disease  Blake George is an 87 y.o. 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  The patient had an episode on 19 September 2020 when he perceived oscillopsia while watching TV, he tried to get up and felt dizzy and woozy, he did not blackout.  The patient underwent a work-up that included carotid Doppler study that was unremarkable, CT of the brain showed no significant small vessel disease, no acute changes were seen.  He has been maintained on Aggrenox, he is doing well with this.  He has not had any recurrence of his symptoms.  He uses a walker to get around, he has chronic gait instability.  He comes in today with his wife.  Past Medical History:  Diagnosis Date   Arthritis    "lower back" (06/10/2013)   Basal cell carcinoma    Right lower leg   Cochlear implant in place    bilaterally   Dizziness    episodic   Enlarged prostate    Gait disturbance    Headache(784.0)    Hearing deficit    deaf Left ear   Hearing loss    Cochlear implant   History of blood transfusion 2001   "probably, related to abdominal OR" (06/10/2013)   Hypertrophic acne scar    benign   Hypertrophy (benign) of prostate    benign prostatic   Leg lesion    Melanoma ([place])    left retinal; "took radiation for it ~ 2 yr ago" (06/10/2013)   Pancreatic adenoma    status post resection   Pulmonary embolism ([place]) 2001   "after big stomach OR; in hospital for 78 days" (06/10/2013)   Renal insufficiency    chronic   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 ([place])    Squamous cell carcinoma, leg    RLE/notes 06/10/2013   Stroke ([place]) ~ 2004; ~ 2012   auditory nerve,cerebellar artery stroke, left superior cerebellar artery; "that's when I lost my hearing" (06/10/2013)   Type II diabetes mellitus  ([place])    Vertigo    no problem past 2 yrs   Visual hallucinations 09/29/2020    Past Surgical History:  Procedure Laterality Date   ABDOMINAL SURGERY  2001   Partial colectomy/partial pancreatectomy / spenectomy   CATARACT EXTRACTION Left    COCHLEAR IMPLANT Bilateral 2006 / 2013   COCHLEAR IMPLANT REMOVAL Left 12/01/2019   COLECTOMY  2001   partial   LESION REMOVAL Right 06/05/2013   Procedure: LESION REMOVAL RIGHT LOWER LEG ;  Surgeon: Harl Bowie, MD;  Location: [facility];  Service: General;  Laterality: Right;   MELANOMA EXCISION Left    Resected, left retina   PANCREATECTOMY  2001   partial   SPLENECTOMY, TOTAL  2001    Family History  Problem Relation Age of Onset   Heart attack Mother    Diabetes Mother    Stroke Father    Heart disease Brother    Diabetes Brother    Cancer - Prostate Brother     Social history:  reports that he quit smoking about 46 years ago. His smoking use included cigarettes. He has a 2.40 pack-year smoking history. He has never used smokeless tobacco. He reports that he does not drink alcohol and does not use drugs.    Allergies  Allergen Reactions   Diamode [Loperamide Hcl]     Reaction unknown   Ivp Dye [Iodinated Diagnostic Agents]     Reaction unknown   Morphine And Related     Reaction unknown   Topiramate Other (See Comments)    Reaction unknown Reaction unknown   Zetia [Ezetimibe]     "family does not remember"   Bacitra-Neomycin-Polymyxin-Hc Anxiety    Had anxiety and insomnia with cortisporin otic drops.    Clobetasol Propionate Rash   Ofloxacin Anxiety    Caused anxiety and insomnia   Plavix [Clopidogrel Bisulfate]     Reaction unknown    Medications:  Prior to Admission medications   Medication Sig Start Date End Date Taking? Authorizing Provider  cyanocobalamin 1000 MCG tablet Take by mouth.   Yes [provider]  dipyridamole-aspirin (AGGRENOX) 200-25 MG per 12 hr capsule Take 1 capsule by mouth 2  (two) times daily. 10/29/14  Yes Marcial Pacas, MD  dutasteride (AVODART) 0.5 MG capsule Take 1 capsule (0.5 mg total) by mouth every morning. 06/24/20  Yes Irine Seal, MD  glucose blood (TRUETRACK TEST) test strip  11/25/17  Yes [provider]  lisinopril (ZESTRIL) 2.5 MG tablet Take 2.5 mg by mouth daily. 01/07/20  Yes [provider]  metFORMIN (GLUCOPHAGE) 500 MG tablet Take by mouth. 10/03/12  Yes [provider]  Multiple Vitamins-Minerals (MULTIVITAMIN PO) Take 1 tablet by mouth daily.    Yes [provider]  tamsulosin (FLOMAX) 0.4 MG CAPS capsule Take 1 capsule (0.4 mg total) by mouth daily after supper. 06/24/20  Yes Irine Seal, MD    ROS:  Out of a complete 14 system review of symptoms, the patient complains only of the following symptoms, and all other reviewed systems are negative.  Walking difficulty Hard of hearing Blindness, left eye  Blood pressure (!) 103/53, pulse (!) 55, height 5\' 11"  (1.803 m), weight 172 lb 3.2 oz (78.1 kg).  Physical Exam  General: The patient is alert and cooperative at the time of the examination.  Skin: No significant peripheral edema is noted.   Neurologic Exam  Mental status: The patient is alert and oriented x 3 at the time of the examination. The patient has apparent normal recent and remote memory, with an apparently normal attention span and concentration ability.   Cranial nerves: Facial symmetry is present. Speech is normal, no aphasia or dysarthria is noted. Extraocular movements are full. Visual fields are full.  Motor: The patient has good strength in all 4 extremities.  Sensory examination: Soft touch sensation is symmetric on the face, arms, and legs.  Coordination: The patient has good finger-nose-finger and heel-to-shin bilaterally.  Gait and station: The patient has a wide-based gait, he can walk independently but usually uses a walker.  Romberg is negative.  Reflexes: Deep tendon reflexes  are symmetric.   CT head 10/15/20:   IMPRESSION: This CT scan of the head without contrast shows the following: 1.   No acute findings. 2.   Mild generalized cortical atrophy typical for age but progressed compared to the 2014 CT scan.  Minimal stable chronic microvascular ischemic changes. 3.   Cochlear implant on the right.  Since 2014 hardware on the left has been removed     Carotid doppler 10/12/20:   Summary: Right Carotid: The extracranial vessels were near-normal with only minimal wall                thickening or plaque.   Left Carotid: Velocities  in the left ICA are consistent with a 1-39% stenosis.   Vertebrals: Bilateral vertebral arteries demonstrate antegrade flow.   Assessment/Plan:  1.  Cerebrovascular disease  The patient has had a relatively unremarkable cerebrovascular work-up.  He will remain on Aggrenox for now.  He claims that he needs to have a dental procedure done, I would be okay with him coming off of Aggrenox for 6 or 7 days prior to the procedure and start after the procedure is done.  The patient will follow up here on an as-needed basis.  Jill Alexanders MD 02/02/2021 11:57 AM  [facility] [address] [place], [place] [address]  Phone [phone] Fax [phone]

## 2021-02-08 DIAGNOSIS — Z299 Encounter for prophylactic measures, unspecified: Secondary | ICD-10-CM | POA: Diagnosis not present

## 2021-02-08 DIAGNOSIS — N183 Chronic kidney disease, stage 3 unspecified: Secondary | ICD-10-CM | POA: Diagnosis not present

## 2021-02-08 DIAGNOSIS — E1122 Type 2 diabetes mellitus with diabetic chronic kidney disease: Secondary | ICD-10-CM | POA: Diagnosis not present

## 2021-02-08 DIAGNOSIS — E1165 Type 2 diabetes mellitus with hyperglycemia: Secondary | ICD-10-CM | POA: Diagnosis not present

## 2021-02-08 DIAGNOSIS — I10 Essential (primary) hypertension: Secondary | ICD-10-CM | POA: Diagnosis not present

## 2021-02-16 ENCOUNTER — Other Ambulatory Visit: Payer: Self-pay

## 2021-02-16 ENCOUNTER — Ambulatory Visit (INDEPENDENT_AMBULATORY_CARE_PROVIDER_SITE_OTHER): Payer: Medicare Other | Admitting: Urology

## 2021-02-16 VITALS — BP 100/54 | HR 74

## 2021-02-16 DIAGNOSIS — N3941 Urge incontinence: Secondary | ICD-10-CM

## 2021-02-16 DIAGNOSIS — R351 Nocturia: Secondary | ICD-10-CM | POA: Diagnosis not present

## 2021-02-16 DIAGNOSIS — N401 Enlarged prostate with lower urinary tract symptoms: Secondary | ICD-10-CM

## 2021-02-16 DIAGNOSIS — N138 Other obstructive and reflux uropathy: Secondary | ICD-10-CM | POA: Diagnosis not present

## 2021-02-16 LAB — URINALYSIS, ROUTINE W REFLEX MICROSCOPIC
Bilirubin, UA: NEGATIVE
Glucose, UA: NEGATIVE
Ketones, UA: NEGATIVE
Nitrite, UA: NEGATIVE
Protein,UA: NEGATIVE
RBC, UA: NEGATIVE
Specific Gravity, UA: 1.02 (ref 1.005–1.030)
Urobilinogen, Ur: 0.2 mg/dL (ref 0.2–1.0)
pH, UA: 6.5 (ref 5.0–7.5)

## 2021-02-16 LAB — MICROSCOPIC EXAMINATION
Bacteria, UA: NONE SEEN
Epithelial Cells (non renal): NONE SEEN /hpf (ref 0–10)
RBC, Urine: NONE SEEN /hpf (ref 0–2)
Renal Epithel, UA: NONE SEEN /hpf

## 2021-02-16 LAB — BLADDER SCAN AMB NON-IMAGING: Scan Result: 47

## 2021-02-16 MED ORDER — DUTASTERIDE 0.5 MG PO CAPS: 0.5000 mg | ORAL_CAPSULE | Freq: Every morning | ORAL | 3 refills | Status: DC

## 2021-02-16 MED ORDER — TAMSULOSIN HCL 0.4 MG PO CAPS: 0.4000 mg | ORAL_CAPSULE | Freq: Every day | ORAL | 3 refills | Status: DC

## 2021-02-16 NOTE — Progress Notes (Signed)
.  pvr=47 Urological Symptom Review  Patient is experiencing the following symptoms: Frequent urination Hard to postpone urination Get up at night to urinate Erection problems (male only)   Review of Systems  Gastrointestinal (upper)  : Negative for upper GI symptoms  Gastrointestinal (lower) : Negative for lower GI symptoms  Constitutional : Negative for symptoms  Skin: Negative for skin symptoms  Eyes: Negative for eye symptoms  Ear/Nose/Throat : Sinus problems  Hematologic/Lymphatic: Easy bruising  Cardiovascular : Negative for cardiovascular symptoms  Respiratory : Negative for respiratory symptoms  Endocrine: Negative for endocrine symptoms  Musculoskeletal: Back pain  Neurological: Dizziness  Psychologic: Negative for psychiatric symptoms

## 2021-02-16 NOTE — Progress Notes (Signed)
Subjective:  1. Urge incontinence   2. BPH with urinary obstruction   3. Nocturia      I have symptoms of an enlarged prostate. HPI: Blake George is a 87 year-old male established patient who is here for symptoms of enlarged prostate.    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   He has been on tamsulosin and dutasteride His IPSS is 13.    He developed post op retention requiring a foley and saw Blake George and had a voiding trial that he passed.  He still has some UUI.   He will occasionally wear a pad for safety but seldom gets it damp.  His PVR is 2ml.   He has had a prior elevated PSA but it was 1.4 in 12/20 which is the last we have. It was 7 prior to treatment.   He has had no gross hematuria but he does have a history of chronic microhematuria but his UA is clear today. He has no associated signs or symptoms.    IPSS     Row Name 02/16/21 1300         International Prostate Symptom Score   How often have you had the sensation of not emptying your bladder? Less than half the time     How often have you had to urinate less than every two hours? Less than 1 in 5 times     How often have you found you stopped and started again several times when you urinated? About half the time     How often have you found it difficult to postpone urination? Less than half the time     How often have you had a weak urinary stream? Less than 1 in 5 times     How often have you had to strain to start urination? Not at All     How many times did you typically get up at night to urinate? 4 Times     Total IPSS Score 13           Quality of Life due to urinary symptoms     If you were to spend the rest of your life with your urinary condition just the way it is now how would you feel about that? Mostly Disatisfied              ROS:  ROS:  A complete review of systems was  performed.  All systems are negative except for pertinent findings as noted.   ROS  Allergies  Allergen Reactions   Diamode [Loperamide Hcl]     Reaction unknown   Ivp Dye [Iodinated Diagnostic Agents]     Reaction unknown   Morphine And Related     Reaction unknown   Topiramate Other (See Comments)    Reaction unknown Reaction unknown   Ciprofloxacin Other (See Comments)    Loss of appetite, problems sleeping   Zetia [Ezetimibe]     "family does not remember"   Bacitra-Neomycin-Polymyxin-Hc Anxiety    Had anxiety and insomnia with cortisporin otic drops.    Clobetasol Propionate Rash   Ofloxacin Anxiety    Caused anxiety and insomnia   Plavix [Clopidogrel Bisulfate]     Reaction unknown    Outpatient Encounter Medications as of 02/16/2021  Medication Sig   cyanocobalamin 1000 MCG tablet Take by mouth.   dipyridamole-aspirin (  AGGRENOX) 200-25 MG per 12 hr capsule Take 1 capsule by mouth 2 (two) times daily.   glucose blood (TRUETRACK TEST) test strip    lisinopril (ZESTRIL) 2.5 MG tablet Take 2.5 mg by mouth daily.   metFORMIN (GLUCOPHAGE) 500 MG tablet Take by mouth.   Multiple Vitamins-Minerals (MULTIVITAMIN PO) Take 1 tablet by mouth daily.    [DISCONTINUED] dutasteride (AVODART) 0.5 MG capsule Take 1 capsule (0.5 mg total) by mouth every morning.   [DISCONTINUED] tamsulosin (FLOMAX) 0.4 MG CAPS capsule Take 1 capsule (0.4 mg total) by mouth daily after supper.   dutasteride (AVODART) 0.5 MG capsule Take 1 capsule (0.5 mg total) by mouth every morning.   tamsulosin (FLOMAX) 0.4 MG CAPS capsule Take 1 capsule (0.4 mg total) by mouth daily after supper.   No facility-administered encounter medications on file as of 02/16/2021.    Past Medical History:  Diagnosis Date   Arthritis    "lower back" (06/10/2013)   Basal cell carcinoma    Right lower leg   Cochlear implant in place    bilaterally   Dizziness    episodic   Enlarged prostate    Gait disturbance     Headache(784.0)    Hearing deficit    deaf Left ear   Hearing loss    Cochlear implant   History of blood transfusion 2001   "probably, related to abdominal OR" (06/10/2013)   Hypertrophic acne scar    benign   Hypertrophy (benign) of prostate    benign prostatic   Leg lesion    Melanoma ([place])    left retinal; "took radiation for it ~ 2 yr ago" (06/10/2013)   Pancreatic adenoma    status post resection   Pulmonary embolism ([place]) 2001   "after big stomach OR; in hospital for 78 days" (06/10/2013)   Renal insufficiency    chronic   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 ([place])    Squamous cell carcinoma, leg    RLE/notes 06/10/2013   Stroke ([place]) ~ 2004; ~ 2012   auditory nerve,cerebellar artery stroke, left superior cerebellar artery; "that's when I lost my hearing" (06/10/2013)   Type II diabetes mellitus ([place])    Vertigo    no problem past 2 yrs   Visual hallucinations 09/29/2020    Past Surgical History:  Procedure Laterality Date   ABDOMINAL SURGERY  2001   Partial colectomy/partial pancreatectomy / spenectomy   CATARACT EXTRACTION Left    COCHLEAR IMPLANT Bilateral 2006 / 2013   COCHLEAR IMPLANT REMOVAL Left 12/01/2019   COLECTOMY  2001   partial   LESION REMOVAL Right 06/05/2013   Procedure: LESION REMOVAL RIGHT LOWER LEG ;  Surgeon: Harl Bowie, MD;  Location: [facility];  Service: General;  Laterality: Right;   MELANOMA EXCISION Left    Resected, left retina   PANCREATECTOMY  2001   partial   SPLENECTOMY, TOTAL  2001    Social History   Socioeconomic History   Marital status: Married    Spouse name: Eloise   Number of children: Not on file   Years of education: Not on file   Highest education level: Not on file  Occupational History   Not on file  Tobacco Use   Smoking status: Former    Packs/day: 0.12    Years: 20.00    Pack years: 2.40    Types: Cigarettes    Quit date: 05/29/1974    Years since quitting: 46.7   Smokeless tobacco:  Never  Substance and Sexual Activity   Alcohol use: No   Drug use: No   Sexual activity: Never  Other Topics Concern   Not on file  Social History Narrative   Lives with Wife   Left Handed   Drinks no caffeine   Social Determinants of Health   Financial Resource Strain: Not on file  Food Insecurity: Not on file  Transportation Needs: Not on file  Physical Activity: Not on file  Stress: Not on file  Social Connections: Not on file  Intimate Partner Violence: Not on file    Family History  Problem Relation Age of Onset   Heart attack Mother    Diabetes Mother    Stroke Father    Heart disease Brother    Diabetes Brother    Cancer - Prostate Brother        Objective: Vitals:   02/16/21 1321  BP: (!) 100/54  Pulse: 74     Physical Exam Vitals reviewed.  Constitutional:      Appearance: Normal appearance.  Neurological:     Mental Status: He is alert.    Lab Results:  Results for orders placed or performed in visit on 02/16/21 (from the past 24 hour(s))  Urinalysis, Routine w reflex microscopic     Status: Abnormal   Collection Time: 02/16/21  1:14 PM  Result Value Ref Range   Specific Gravity, UA 1.020 1.005 - 1.030   pH, UA 6.5 5.0 - 7.5   Color, UA Yellow Yellow   Appearance Ur Clear Clear   Leukocytes,UA Trace (A) Negative   Protein,UA Negative Negative/Trace   Glucose, UA Negative Negative   Ketones, UA Negative Negative   RBC, UA Negative Negative   Bilirubin, UA Negative Negative   Urobilinogen, Ur 0.2 0.2 - 1.0 mg/dL   Nitrite, UA Negative Negative   Microscopic Examination See below:    Narrative   Performed at:  [facility] [address] [place], [place]  [id number] Lab Director: Mina Marble MT, Phone:  [phone]  Microscopic Examination     Status: None   Collection Time: 02/16/21  1:14 PM   Urine  Result Value Ref Range   WBC, UA 0-5 0 - 5 /hpf   RBC None seen 0 - 2 /hpf   Epithelial Cells (non renal) None  seen 0 - 10 /hpf   Renal Epithel, UA None seen None seen /hpf   Bacteria, UA None seen None seen/Few   Narrative   Performed at:  [facility] [address] [place], [place]  [id number] Lab Director: [place], Phone:  [phone]    BMET No results for input(s): NA, K, CL, CO2, GLUCOSE, BUN, CREATININE, CALCIUM in the last 72 hours. PSA PSA  Date Value Ref Range Status  08/25/2019 1.4 < OR = 4.0 ng/mL Final    Comment:    The total PSA value from this assay system is  standardized against the WHO standard. The test  result will be approximately 20% lower when compared  to the equimolar-standardized total PSA (Beckman  Coulter). Comparison of serial PSA results should be  interpreted with this fact in mind. . This test was performed using the Siemens  chemiluminescent method. Values obtained from  different assay methods cannot be used interchangeably. PSA levels, regardless of value, should not be interpreted as absolute evidence of the presence or absence of disease.    No results found for: TESTOSTERONE    Studies/Results: UA is  clear.     Assessment & Plan: He has stable  nocturia and urgency with BPH and BOO. He will remains on tamsulosin and dutasteride.   I recommended he cut out coffee and diet sodas which can contribute to the nocturia.      Elevated PSA was well suppressed on dutasteride at last check in 12/20.  He doesn't need further testing.    Microhematuria.   UA clear today,.   Meds ordered this encounter  Medications   tamsulosin (FLOMAX) 0.4 MG CAPS capsule    Sig: Take 1 capsule (0.4 mg total) by mouth daily after supper.    Dispense:  90 capsule    Refill:  3   dutasteride (AVODART) 0.5 MG capsule    Sig: Take 1 capsule (0.5 mg total) by mouth every morning.    Dispense:  90 capsule    Refill:  3     Orders Placed This Encounter  Procedures   Microscopic Examination   Urinalysis, Routine w reflex microscopic    BLADDER SCAN AMB NON-IMAGING      Return in about 1 year (around 02/16/2022) for with PVR.   CC: Glenda Chroman, MD      Irine Seal 02/16/2021

## 2021-03-14 DIAGNOSIS — N400 Enlarged prostate without lower urinary tract symptoms: Secondary | ICD-10-CM | POA: Diagnosis not present

## 2021-03-14 DIAGNOSIS — E1165 Type 2 diabetes mellitus with hyperglycemia: Secondary | ICD-10-CM | POA: Diagnosis not present

## 2021-03-14 DIAGNOSIS — I10 Essential (primary) hypertension: Secondary | ICD-10-CM | POA: Diagnosis not present

## 2021-03-14 DIAGNOSIS — R5383 Other fatigue: Secondary | ICD-10-CM | POA: Diagnosis not present

## 2021-03-14 DIAGNOSIS — Z79899 Other long term (current) drug therapy: Secondary | ICD-10-CM | POA: Diagnosis not present

## 2021-03-14 DIAGNOSIS — N183 Chronic kidney disease, stage 3 unspecified: Secondary | ICD-10-CM | POA: Diagnosis not present

## 2021-03-14 DIAGNOSIS — E1122 Type 2 diabetes mellitus with diabetic chronic kidney disease: Secondary | ICD-10-CM | POA: Diagnosis not present

## 2021-03-14 DIAGNOSIS — Z299 Encounter for prophylactic measures, unspecified: Secondary | ICD-10-CM | POA: Diagnosis not present

## 2021-03-21 DIAGNOSIS — I10 Essential (primary) hypertension: Secondary | ICD-10-CM | POA: Diagnosis not present

## 2021-03-21 DIAGNOSIS — Z0000 Encounter for general adult medical examination without abnormal findings: Secondary | ICD-10-CM | POA: Diagnosis not present

## 2021-03-21 DIAGNOSIS — Z1339 Encounter for screening examination for other mental health and behavioral disorders: Secondary | ICD-10-CM | POA: Diagnosis not present

## 2021-03-21 DIAGNOSIS — Z1331 Encounter for screening for depression: Secondary | ICD-10-CM | POA: Diagnosis not present

## 2021-03-21 DIAGNOSIS — Z6825 Body mass index (BMI) 25.0-25.9, adult: Secondary | ICD-10-CM | POA: Diagnosis not present

## 2021-03-21 DIAGNOSIS — Z7189 Other specified counseling: Secondary | ICD-10-CM | POA: Diagnosis not present

## 2021-03-21 DIAGNOSIS — Z299 Encounter for prophylactic measures, unspecified: Secondary | ICD-10-CM | POA: Diagnosis not present

## 2021-03-21 DIAGNOSIS — Z789 Other specified health status: Secondary | ICD-10-CM | POA: Diagnosis not present

## 2021-03-22 DIAGNOSIS — Z125 Encounter for screening for malignant neoplasm of prostate: Secondary | ICD-10-CM | POA: Diagnosis not present

## 2021-03-22 DIAGNOSIS — E7800 Pure hypercholesterolemia, unspecified: Secondary | ICD-10-CM | POA: Diagnosis not present

## 2021-03-22 DIAGNOSIS — R5383 Other fatigue: Secondary | ICD-10-CM | POA: Diagnosis not present

## 2021-03-22 DIAGNOSIS — Z79899 Other long term (current) drug therapy: Secondary | ICD-10-CM | POA: Diagnosis not present

## 2021-04-04 DIAGNOSIS — Z9889 Other specified postprocedural states: Secondary | ICD-10-CM | POA: Diagnosis not present

## 2021-04-04 DIAGNOSIS — H6121 Impacted cerumen, right ear: Secondary | ICD-10-CM | POA: Diagnosis not present

## 2021-04-04 DIAGNOSIS — H6123 Impacted cerumen, bilateral: Secondary | ICD-10-CM | POA: Diagnosis not present

## 2021-04-04 DIAGNOSIS — Z9621 Cochlear implant status: Secondary | ICD-10-CM | POA: Diagnosis not present

## 2021-04-04 DIAGNOSIS — H903 Sensorineural hearing loss, bilateral: Secondary | ICD-10-CM | POA: Diagnosis not present

## 2021-04-04 DIAGNOSIS — H95192 Other disorders following mastoidectomy, left ear: Secondary | ICD-10-CM | POA: Diagnosis not present

## 2021-04-04 DIAGNOSIS — Z9089 Acquired absence of other organs: Secondary | ICD-10-CM | POA: Diagnosis not present

## 2021-04-13 DIAGNOSIS — C6932 Malignant neoplasm of left choroid: Secondary | ICD-10-CM | POA: Diagnosis not present

## 2021-04-13 DIAGNOSIS — E119 Type 2 diabetes mellitus without complications: Secondary | ICD-10-CM | POA: Diagnosis not present

## 2021-04-13 DIAGNOSIS — Z961 Presence of intraocular lens: Secondary | ICD-10-CM | POA: Diagnosis not present

## 2021-04-13 DIAGNOSIS — H47012 Ischemic optic neuropathy, left eye: Secondary | ICD-10-CM | POA: Diagnosis not present

## 2021-04-28 DIAGNOSIS — E1165 Type 2 diabetes mellitus with hyperglycemia: Secondary | ICD-10-CM | POA: Diagnosis not present

## 2021-04-28 DIAGNOSIS — I10 Essential (primary) hypertension: Secondary | ICD-10-CM | POA: Diagnosis not present

## 2021-04-28 DIAGNOSIS — R5383 Other fatigue: Secondary | ICD-10-CM | POA: Diagnosis not present

## 2021-04-28 DIAGNOSIS — Z299 Encounter for prophylactic measures, unspecified: Secondary | ICD-10-CM | POA: Diagnosis not present

## 2021-05-16 DIAGNOSIS — D692 Other nonthrombocytopenic purpura: Secondary | ICD-10-CM | POA: Diagnosis not present

## 2021-05-16 DIAGNOSIS — I10 Essential (primary) hypertension: Secondary | ICD-10-CM | POA: Diagnosis not present

## 2021-05-16 DIAGNOSIS — E1165 Type 2 diabetes mellitus with hyperglycemia: Secondary | ICD-10-CM | POA: Diagnosis not present

## 2021-05-16 DIAGNOSIS — E882 Lipomatosis, not elsewhere classified: Secondary | ICD-10-CM | POA: Diagnosis not present

## 2021-05-16 DIAGNOSIS — Z299 Encounter for prophylactic measures, unspecified: Secondary | ICD-10-CM | POA: Diagnosis not present

## 2021-06-21 DIAGNOSIS — Z20828 Contact with and (suspected) exposure to other viral communicable diseases: Secondary | ICD-10-CM | POA: Diagnosis not present

## 2021-06-21 DIAGNOSIS — Z23 Encounter for immunization: Secondary | ICD-10-CM | POA: Diagnosis not present

## 2021-07-05 DIAGNOSIS — Z885 Allergy status to narcotic agent status: Secondary | ICD-10-CM | POA: Diagnosis not present

## 2021-07-05 DIAGNOSIS — Z888 Allergy status to other drugs, medicaments and biological substances status: Secondary | ICD-10-CM | POA: Diagnosis not present

## 2021-07-05 DIAGNOSIS — H6121 Impacted cerumen, right ear: Secondary | ICD-10-CM | POA: Diagnosis not present

## 2021-07-05 DIAGNOSIS — Z9621 Cochlear implant status: Secondary | ICD-10-CM | POA: Diagnosis not present

## 2021-07-05 DIAGNOSIS — Z91041 Radiographic dye allergy status: Secondary | ICD-10-CM | POA: Diagnosis not present

## 2021-07-05 DIAGNOSIS — Z881 Allergy status to other antibiotic agents status: Secondary | ICD-10-CM | POA: Diagnosis not present

## 2021-07-05 DIAGNOSIS — Z9089 Acquired absence of other organs: Secondary | ICD-10-CM | POA: Diagnosis not present

## 2021-07-05 DIAGNOSIS — H95192 Other disorders following mastoidectomy, left ear: Secondary | ICD-10-CM | POA: Diagnosis not present

## 2021-07-05 DIAGNOSIS — H903 Sensorineural hearing loss, bilateral: Secondary | ICD-10-CM | POA: Diagnosis not present

## 2021-07-05 DIAGNOSIS — Z87891 Personal history of nicotine dependence: Secondary | ICD-10-CM | POA: Diagnosis not present

## 2021-07-11 DIAGNOSIS — H3500 Unspecified background retinopathy: Secondary | ICD-10-CM | POA: Diagnosis not present

## 2021-07-11 DIAGNOSIS — H47012 Ischemic optic neuropathy, left eye: Secondary | ICD-10-CM | POA: Diagnosis not present

## 2021-07-11 DIAGNOSIS — E119 Type 2 diabetes mellitus without complications: Secondary | ICD-10-CM | POA: Diagnosis not present

## 2021-07-11 DIAGNOSIS — H52202 Unspecified astigmatism, left eye: Secondary | ICD-10-CM | POA: Diagnosis not present

## 2021-09-05 DIAGNOSIS — Z299 Encounter for prophylactic measures, unspecified: Secondary | ICD-10-CM | POA: Diagnosis not present

## 2021-09-05 DIAGNOSIS — I10 Essential (primary) hypertension: Secondary | ICD-10-CM | POA: Diagnosis not present

## 2021-09-05 DIAGNOSIS — I739 Peripheral vascular disease, unspecified: Secondary | ICD-10-CM | POA: Diagnosis not present

## 2021-09-05 DIAGNOSIS — E1165 Type 2 diabetes mellitus with hyperglycemia: Secondary | ICD-10-CM | POA: Diagnosis not present

## 2021-09-05 DIAGNOSIS — E1122 Type 2 diabetes mellitus with diabetic chronic kidney disease: Secondary | ICD-10-CM | POA: Diagnosis not present

## 2021-09-05 DIAGNOSIS — N183 Chronic kidney disease, stage 3 unspecified: Secondary | ICD-10-CM | POA: Diagnosis not present

## 2021-09-06 DIAGNOSIS — J329 Chronic sinusitis, unspecified: Secondary | ICD-10-CM | POA: Diagnosis not present

## 2021-09-06 DIAGNOSIS — Z299 Encounter for prophylactic measures, unspecified: Secondary | ICD-10-CM | POA: Diagnosis not present

## 2021-09-06 DIAGNOSIS — E1165 Type 2 diabetes mellitus with hyperglycemia: Secondary | ICD-10-CM | POA: Diagnosis not present

## 2021-09-06 DIAGNOSIS — I10 Essential (primary) hypertension: Secondary | ICD-10-CM | POA: Diagnosis not present

## 2021-09-06 DIAGNOSIS — Z6825 Body mass index (BMI) 25.0-25.9, adult: Secondary | ICD-10-CM | POA: Diagnosis not present

## 2021-09-11 DIAGNOSIS — Z299 Encounter for prophylactic measures, unspecified: Secondary | ICD-10-CM | POA: Diagnosis not present

## 2021-09-11 DIAGNOSIS — E1122 Type 2 diabetes mellitus with diabetic chronic kidney disease: Secondary | ICD-10-CM | POA: Diagnosis not present

## 2021-09-11 DIAGNOSIS — I10 Essential (primary) hypertension: Secondary | ICD-10-CM | POA: Diagnosis not present

## 2021-09-11 DIAGNOSIS — R252 Cramp and spasm: Secondary | ICD-10-CM | POA: Diagnosis not present

## 2021-10-01 DIAGNOSIS — R531 Weakness: Secondary | ICD-10-CM | POA: Diagnosis not present

## 2021-10-05 DIAGNOSIS — H95192 Other disorders following mastoidectomy, left ear: Secondary | ICD-10-CM | POA: Diagnosis not present

## 2021-10-05 DIAGNOSIS — Z9089 Acquired absence of other organs: Secondary | ICD-10-CM | POA: Diagnosis not present

## 2021-10-05 DIAGNOSIS — H903 Sensorineural hearing loss, bilateral: Secondary | ICD-10-CM | POA: Diagnosis not present

## 2021-10-05 DIAGNOSIS — H6121 Impacted cerumen, right ear: Secondary | ICD-10-CM | POA: Diagnosis not present

## 2021-10-05 DIAGNOSIS — Z9621 Cochlear implant status: Secondary | ICD-10-CM | POA: Diagnosis not present

## 2021-10-27 DIAGNOSIS — H903 Sensorineural hearing loss, bilateral: Secondary | ICD-10-CM | POA: Diagnosis not present

## 2021-12-07 DIAGNOSIS — N183 Chronic kidney disease, stage 3 unspecified: Secondary | ICD-10-CM | POA: Diagnosis not present

## 2021-12-07 DIAGNOSIS — Z6825 Body mass index (BMI) 25.0-25.9, adult: Secondary | ICD-10-CM | POA: Diagnosis not present

## 2021-12-07 DIAGNOSIS — I10 Essential (primary) hypertension: Secondary | ICD-10-CM | POA: Diagnosis not present

## 2021-12-07 DIAGNOSIS — Z299 Encounter for prophylactic measures, unspecified: Secondary | ICD-10-CM | POA: Diagnosis not present

## 2021-12-07 DIAGNOSIS — E1165 Type 2 diabetes mellitus with hyperglycemia: Secondary | ICD-10-CM | POA: Diagnosis not present

## 2021-12-07 DIAGNOSIS — E1122 Type 2 diabetes mellitus with diabetic chronic kidney disease: Secondary | ICD-10-CM | POA: Diagnosis not present

## 2022-01-02 DIAGNOSIS — H6121 Impacted cerumen, right ear: Secondary | ICD-10-CM | POA: Diagnosis not present

## 2022-01-02 DIAGNOSIS — Z9089 Acquired absence of other organs: Secondary | ICD-10-CM | POA: Diagnosis not present

## 2022-01-02 DIAGNOSIS — H95192 Other disorders following mastoidectomy, left ear: Secondary | ICD-10-CM | POA: Diagnosis not present

## 2022-01-11 DIAGNOSIS — H903 Sensorineural hearing loss, bilateral: Secondary | ICD-10-CM | POA: Diagnosis not present

## 2022-02-15 ENCOUNTER — Encounter: Payer: Self-pay | Admitting: Urology

## 2022-02-15 ENCOUNTER — Ambulatory Visit (INDEPENDENT_AMBULATORY_CARE_PROVIDER_SITE_OTHER): Payer: Medicare Other | Admitting: Urology

## 2022-02-15 VITALS — BP 105/53 | HR 58

## 2022-02-15 DIAGNOSIS — N401 Enlarged prostate with lower urinary tract symptoms: Secondary | ICD-10-CM | POA: Diagnosis not present

## 2022-02-15 DIAGNOSIS — N138 Other obstructive and reflux uropathy: Secondary | ICD-10-CM

## 2022-02-15 DIAGNOSIS — N3941 Urge incontinence: Secondary | ICD-10-CM | POA: Diagnosis not present

## 2022-02-15 DIAGNOSIS — R351 Nocturia: Secondary | ICD-10-CM | POA: Diagnosis not present

## 2022-02-15 LAB — URINALYSIS, ROUTINE W REFLEX MICROSCOPIC
Bilirubin, UA: NEGATIVE
Glucose, UA: NEGATIVE
Ketones, UA: NEGATIVE
Leukocytes,UA: NEGATIVE
Nitrite, UA: NEGATIVE
Protein,UA: NEGATIVE
RBC, UA: NEGATIVE
Specific Gravity, UA: 1.015 (ref 1.005–1.030)
Urobilinogen, Ur: 0.2 mg/dL (ref 0.2–1.0)
pH, UA: 5 (ref 5.0–7.5)

## 2022-02-15 LAB — BLADDER SCAN AMB NON-IMAGING: Scan Result: 43

## 2022-02-15 MED ORDER — TAMSULOSIN HCL 0.4 MG PO CAPS: 0.4000 mg | ORAL_CAPSULE | Freq: Every day | ORAL | 3 refills | Status: DC

## 2022-02-15 MED ORDER — DUTASTERIDE 0.5 MG PO CAPS: 0.5000 mg | ORAL_CAPSULE | Freq: Every morning | ORAL | 3 refills | Status: DC

## 2022-02-15 NOTE — Progress Notes (Unsigned)
post void residual=43

## 2022-02-15 NOTE — Progress Notes (Unsigned)
Subjective:  1. Urge incontinence   2. BPH with urinary obstruction   3. Nocturia      I have symptoms of an enlarged prostate. HPI: Blake George is a 88 year-old male established patient who is here for symptoms of enlarged prostate.    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   He has been on tamsulosin and dutasteride His IPSS is 9 with nocturia x 4.  He is now on Metformin and has had increased dribbling of urine with urgency.     He will occasionally wear a pad for safety but seldom gets it damp.  His PVR is 25m.   He has had a prior elevated PSA but it was 1.4 in 12/20 which is the last we have. It was 7 prior to treatment.   He has had no gross hematuria but he does have a history of chronic microhematuria but his UA is clear today. He has no associated signs or symptoms.    IPSS     Row Name 02/15/22 1000         International Prostate Symptom Score   How often have you had the sensation of not emptying your bladder? Not at All     How often have you had to urinate less than every two hours? About half the time     How often have you found you stopped and started again several times when you urinated? Not at All     How often have you found it difficult to postpone urination? Less than half the time     How often have you had a weak urinary stream? Not at All     How often have you had to strain to start urination? Not at All     How many times did you typically get up at night to urinate? 4 Times     Total IPSS Score 9       Quality of Life due to urinary symptoms   If you were to spend the rest of your life with your urinary condition just the way it is now how would you feel about that? Unhappy                ROS:  ROS:  A complete review of systems was performed.  All systems are negative except for pertinent findings as noted.    ROS  Allergies  Allergen Reactions   Diamode [Loperamide Hcl]     Reaction unknown   Ivp Dye [Iodinated Contrast Media]     Reaction unknown   Morphine And Related     Reaction unknown   Topiramate Other (See Comments)    Reaction unknown Reaction unknown   Ciprofloxacin Other (See Comments)    Loss of appetite, problems sleeping   Zetia [Ezetimibe]     "family does not remember"   Bacitra-Neomycin-Polymyxin-Hc Anxiety    Had anxiety and insomnia with cortisporin otic drops.    Clobetasol Propionate Rash   Ofloxacin Anxiety    Caused anxiety and insomnia   Plavix [Clopidogrel Bisulfate]     Reaction unknown    Outpatient Encounter Medications as of 02/15/2022  Medication Sig   cyanocobalamin 1000 MCG tablet Take by mouth.   dipyridamole-aspirin (AGGRENOX) 200-25 MG per 12 hr capsule Take 1 capsule by mouth 2 (two) times daily.   dutasteride (AVODART) 0.5  MG capsule Take 1 capsule (0.5 mg total) by mouth every morning.   glucose blood (TRUETRACK TEST) test strip    lisinopril (ZESTRIL) 2.5 MG tablet Take 2.5 mg by mouth daily.   metFORMIN (GLUCOPHAGE) 500 MG tablet Take by mouth.   metFORMIN (GLUCOPHAGE-XR) 500 MG 24 hr tablet Take 500 mg by mouth 2 (two) times daily.   Multiple Vitamins-Minerals (MULTIVITAMIN PO) Take 1 tablet by mouth daily.    tamsulosin (FLOMAX) 0.4 MG CAPS capsule Take 1 capsule (0.4 mg total) by mouth daily after supper.   [DISCONTINUED] dutasteride (AVODART) 0.5 MG capsule Take 1 capsule (0.5 mg total) by mouth every morning.   [DISCONTINUED] tamsulosin (FLOMAX) 0.4 MG CAPS capsule Take 1 capsule (0.4 mg total) by mouth daily after supper.   No facility-administered encounter medications on file as of 02/15/2022.    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; "took radiation for it ~ 2 yr ago" (06/10/2013)   Pancreatic adenoma    status post resection   Pulmonary embolism ([place]) 2001   "after big stomach OR; in hospital for 78 days" (06/10/2013)   Renal insufficiency    chronic   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 ([place])    Squamous cell carcinoma, leg    RLE/notes 06/10/2013   Stroke ([place]) ~ 2004; ~ 2012   auditory nerve,cerebellar artery stroke, left superior cerebellar artery; "that's when I lost my hearing" (06/10/2013)   Type II diabetes mellitus ([place])    Vertigo    no problem past 2 yrs   Visual hallucinations 09/29/2020    Past Surgical History:  Procedure Laterality Date   ABDOMINAL SURGERY  2001   Partial colectomy/partial pancreatectomy / spenectomy   CATARACT EXTRACTION Left    COCHLEAR IMPLANT Bilateral 2006 / 2013   COCHLEAR IMPLANT REMOVAL Left 12/01/2019   COLECTOMY  2001   partial   LESION REMOVAL Right 06/05/2013   Procedure: LESION REMOVAL RIGHT LOWER LEG ;  Surgeon: Harl Bowie, MD;  Location: [facility];  Service: General;  Laterality: Right;   MELANOMA EXCISION Left    Resected, left retina   PANCREATECTOMY  2001   partial   SPLENECTOMY, TOTAL  2001    Social History   Socioeconomic History   Marital status: Married    Spouse name: Eloise   Number of children: Not on file   Years of education: Not on file   Highest education level: Not on file  Occupational History   Not on file  Tobacco Use   Smoking status: Former    Packs/day: 0.12    Years: 20.00    Total pack years: 2.40    Types: Cigarettes    Quit date: 05/29/1974    Years since quitting: 47.7    Smokeless tobacco: Never  Substance and  Sexual Activity   Alcohol use: No   Drug use: No   Sexual activity: Never  Other Topics Concern   Not on file  Social History Narrative   Lives with Wife   Left Handed   Drinks no caffeine   Social Determinants of Health   Financial Resource Strain: Not on file  Food Insecurity: Not on file  Transportation Needs: Not on file  Physical Activity: Not on file  Stress: Not on file  Social Connections: Not on file  Intimate Partner Violence: Not on file    Family History  Problem Relation Age of Onset   Heart attack Mother    Diabetes Mother    Stroke Father    Heart disease Brother    Diabetes Brother    Cancer - Prostate Brother        Objective: Vitals:   02/15/22 1033  BP: (!) 105/53  Pulse: (!) 58     Physical Exam Vitals reviewed.  Constitutional:      Appearance: Normal appearance.  Neurological:     Mental Status: He is alert.     Lab Results:  No results found for this or any previous visit (from the past 24 hour(s)).   BMET No results for input(s): "NA", "K", "CL", "CO2", "GLUCOSE", "BUN", "CREATININE", "CALCIUM" in the last 72 hours. PSA PSA  Date Value Ref Range Status  08/25/2019 1.4 < OR = 4.0 ng/mL Final    Comment:    The total PSA value from this assay system is  standardized against the WHO standard. The test  result will be approximately 20% lower when compared  to the equimolar-standardized total PSA (Beckman  Coulter). Comparison of serial PSA results should be  interpreted with this fact in mind. . This test was performed using the Siemens  chemiluminescent method. Values obtained from  different assay methods cannot be used interchangeably. PSA levels, regardless of value, should not be interpreted as absolute evidence of the presence or absence of disease.    No results found for: "TESTOSTERONE"    Studies/Results: UA is clear.     Assessment & Plan: He has increased   nocturia and urgency  with mild UUI and  BPH and BOO. He will remains on tamsulosin and dutasteride.     Elevated PSA was well suppressed on dutasteride at last check in 12/20.  He doesn't need further testing.    Microhematuria.   UA clear today,.   Meds ordered this encounter  Medications   dutasteride (AVODART) 0.5 MG capsule    Sig: Take 1 capsule (0.5 mg total) by mouth every morning.    Dispense:  90 capsule    Refill:  3   tamsulosin (FLOMAX) 0.4 MG CAPS capsule    Sig: Take 1 capsule (0.4 mg total) by mouth daily after supper.    Dispense:  90 capsule    Refill:  3     Orders Placed This Encounter  Procedures   Urinalysis, Routine w reflex microscopic   BLADDER SCAN AMB NON-IMAGING      No follow-ups on file.   CC: Glenda Chroman, MD      Irine Seal 02/15/2022

## 2022-03-15 DIAGNOSIS — Z299 Encounter for prophylactic measures, unspecified: Secondary | ICD-10-CM | POA: Diagnosis not present

## 2022-03-15 DIAGNOSIS — I10 Essential (primary) hypertension: Secondary | ICD-10-CM | POA: Diagnosis not present

## 2022-03-15 DIAGNOSIS — E1165 Type 2 diabetes mellitus with hyperglycemia: Secondary | ICD-10-CM | POA: Diagnosis not present

## 2022-03-29 ENCOUNTER — Telehealth: Payer: Self-pay

## 2022-03-29 MED ORDER — SILODOSIN 4 MG PO CAPS: 4.0000 mg | ORAL_CAPSULE | Freq: Every day | ORAL | 11 refills | Status: DC

## 2022-03-29 NOTE — Telephone Encounter (Signed)
Home health called to request dutasteride be discontinued due to low blood pressure.  Verbal from Dr. Jeffie Pollock to d/c tamsulosin not dutasteride and to begin silodosin '4mg'$ .  Rx sent to pharmacy and nurse will inform wife and patient.

## 2022-04-04 DIAGNOSIS — Z7984 Long term (current) use of oral hypoglycemic drugs: Secondary | ICD-10-CM | POA: Diagnosis not present

## 2022-04-04 DIAGNOSIS — H6121 Impacted cerumen, right ear: Secondary | ICD-10-CM | POA: Diagnosis not present

## 2022-04-04 DIAGNOSIS — H903 Sensorineural hearing loss, bilateral: Secondary | ICD-10-CM | POA: Diagnosis not present

## 2022-04-04 DIAGNOSIS — H95192 Other disorders following mastoidectomy, left ear: Secondary | ICD-10-CM | POA: Diagnosis not present

## 2022-04-04 DIAGNOSIS — Z9089 Acquired absence of other organs: Secondary | ICD-10-CM | POA: Diagnosis not present

## 2022-04-04 DIAGNOSIS — Z8673 Personal history of transient ischemic attack (TIA), and cerebral infarction without residual deficits: Secondary | ICD-10-CM | POA: Diagnosis not present

## 2022-04-04 DIAGNOSIS — E119 Type 2 diabetes mellitus without complications: Secondary | ICD-10-CM | POA: Diagnosis not present

## 2022-04-04 DIAGNOSIS — Z9621 Cochlear implant status: Secondary | ICD-10-CM | POA: Diagnosis not present

## 2022-04-17 DIAGNOSIS — Z6824 Body mass index (BMI) 24.0-24.9, adult: Secondary | ICD-10-CM | POA: Diagnosis not present

## 2022-04-17 DIAGNOSIS — E7800 Pure hypercholesterolemia, unspecified: Secondary | ICD-10-CM | POA: Diagnosis not present

## 2022-04-17 DIAGNOSIS — Z7189 Other specified counseling: Secondary | ICD-10-CM | POA: Diagnosis not present

## 2022-04-17 DIAGNOSIS — Z299 Encounter for prophylactic measures, unspecified: Secondary | ICD-10-CM | POA: Diagnosis not present

## 2022-04-17 DIAGNOSIS — Z1339 Encounter for screening examination for other mental health and behavioral disorders: Secondary | ICD-10-CM | POA: Diagnosis not present

## 2022-04-17 DIAGNOSIS — Z79899 Other long term (current) drug therapy: Secondary | ICD-10-CM | POA: Diagnosis not present

## 2022-04-17 DIAGNOSIS — Z0000 Encounter for general adult medical examination without abnormal findings: Secondary | ICD-10-CM | POA: Diagnosis not present

## 2022-04-17 DIAGNOSIS — Z1331 Encounter for screening for depression: Secondary | ICD-10-CM | POA: Diagnosis not present

## 2022-04-17 DIAGNOSIS — R5383 Other fatigue: Secondary | ICD-10-CM | POA: Diagnosis not present

## 2022-04-18 DIAGNOSIS — Z79899 Other long term (current) drug therapy: Secondary | ICD-10-CM | POA: Diagnosis not present

## 2022-04-18 DIAGNOSIS — E7800 Pure hypercholesterolemia, unspecified: Secondary | ICD-10-CM | POA: Diagnosis not present

## 2022-04-18 DIAGNOSIS — R5383 Other fatigue: Secondary | ICD-10-CM | POA: Diagnosis not present

## 2022-04-26 DIAGNOSIS — E1165 Type 2 diabetes mellitus with hyperglycemia: Secondary | ICD-10-CM | POA: Diagnosis not present

## 2022-04-26 DIAGNOSIS — E559 Vitamin D deficiency, unspecified: Secondary | ICD-10-CM | POA: Diagnosis not present

## 2022-06-08 DIAGNOSIS — Z23 Encounter for immunization: Secondary | ICD-10-CM | POA: Diagnosis not present

## 2022-06-20 DIAGNOSIS — E1165 Type 2 diabetes mellitus with hyperglycemia: Secondary | ICD-10-CM | POA: Diagnosis not present

## 2022-06-20 DIAGNOSIS — E1122 Type 2 diabetes mellitus with diabetic chronic kidney disease: Secondary | ICD-10-CM | POA: Diagnosis not present

## 2022-06-20 DIAGNOSIS — I10 Essential (primary) hypertension: Secondary | ICD-10-CM | POA: Diagnosis not present

## 2022-06-20 DIAGNOSIS — N183 Chronic kidney disease, stage 3 unspecified: Secondary | ICD-10-CM | POA: Diagnosis not present

## 2022-06-20 DIAGNOSIS — Z299 Encounter for prophylactic measures, unspecified: Secondary | ICD-10-CM | POA: Diagnosis not present

## 2022-06-28 DIAGNOSIS — C6932 Malignant neoplasm of left choroid: Secondary | ICD-10-CM | POA: Diagnosis not present

## 2022-06-28 DIAGNOSIS — H47012 Ischemic optic neuropathy, left eye: Secondary | ICD-10-CM | POA: Diagnosis not present

## 2022-06-28 DIAGNOSIS — Z961 Presence of intraocular lens: Secondary | ICD-10-CM | POA: Diagnosis not present

## 2022-06-28 DIAGNOSIS — E119 Type 2 diabetes mellitus without complications: Secondary | ICD-10-CM | POA: Diagnosis not present

## 2022-07-05 DIAGNOSIS — Z9621 Cochlear implant status: Secondary | ICD-10-CM | POA: Diagnosis not present

## 2022-07-05 DIAGNOSIS — H9212 Otorrhea, left ear: Secondary | ICD-10-CM | POA: Diagnosis not present

## 2022-07-05 DIAGNOSIS — Z9089 Acquired absence of other organs: Secondary | ICD-10-CM | POA: Diagnosis not present

## 2022-07-05 DIAGNOSIS — H903 Sensorineural hearing loss, bilateral: Secondary | ICD-10-CM | POA: Diagnosis not present

## 2022-07-05 DIAGNOSIS — H95192 Other disorders following mastoidectomy, left ear: Secondary | ICD-10-CM | POA: Diagnosis not present

## 2022-07-05 DIAGNOSIS — H6121 Impacted cerumen, right ear: Secondary | ICD-10-CM | POA: Diagnosis not present

## 2022-07-26 DIAGNOSIS — Z299 Encounter for prophylactic measures, unspecified: Secondary | ICD-10-CM | POA: Diagnosis not present

## 2022-07-26 DIAGNOSIS — U071 COVID-19: Secondary | ICD-10-CM | POA: Diagnosis not present

## 2022-07-26 DIAGNOSIS — E1165 Type 2 diabetes mellitus with hyperglycemia: Secondary | ICD-10-CM | POA: Diagnosis not present

## 2022-07-26 DIAGNOSIS — I10 Essential (primary) hypertension: Secondary | ICD-10-CM | POA: Diagnosis not present

## 2022-09-19 DIAGNOSIS — H4720 Unspecified optic atrophy: Secondary | ICD-10-CM | POA: Diagnosis not present

## 2022-09-19 DIAGNOSIS — H33302 Unspecified retinal break, left eye: Secondary | ICD-10-CM | POA: Diagnosis not present

## 2022-09-19 DIAGNOSIS — H52201 Unspecified astigmatism, right eye: Secondary | ICD-10-CM | POA: Diagnosis not present

## 2022-09-19 DIAGNOSIS — E119 Type 2 diabetes mellitus without complications: Secondary | ICD-10-CM | POA: Diagnosis not present

## 2022-09-19 DIAGNOSIS — H26491 Other secondary cataract, right eye: Secondary | ICD-10-CM | POA: Diagnosis not present

## 2022-10-12 DIAGNOSIS — H7012 Chronic mastoiditis, left ear: Secondary | ICD-10-CM | POA: Diagnosis not present

## 2022-10-12 DIAGNOSIS — H903 Sensorineural hearing loss, bilateral: Secondary | ICD-10-CM | POA: Diagnosis not present

## 2022-10-12 DIAGNOSIS — L929 Granulomatous disorder of the skin and subcutaneous tissue, unspecified: Secondary | ICD-10-CM | POA: Diagnosis not present

## 2022-10-12 DIAGNOSIS — H9212 Otorrhea, left ear: Secondary | ICD-10-CM | POA: Diagnosis not present

## 2022-10-12 DIAGNOSIS — H6121 Impacted cerumen, right ear: Secondary | ICD-10-CM | POA: Diagnosis not present

## 2022-10-12 DIAGNOSIS — Z9621 Cochlear implant status: Secondary | ICD-10-CM | POA: Diagnosis not present

## 2022-10-12 DIAGNOSIS — H95192 Other disorders following mastoidectomy, left ear: Secondary | ICD-10-CM | POA: Diagnosis not present

## 2022-10-12 DIAGNOSIS — Z9089 Acquired absence of other organs: Secondary | ICD-10-CM | POA: Diagnosis not present

## 2022-10-26 DIAGNOSIS — H903 Sensorineural hearing loss, bilateral: Secondary | ICD-10-CM | POA: Diagnosis not present

## 2022-10-26 DIAGNOSIS — R2689 Other abnormalities of gait and mobility: Secondary | ICD-10-CM | POA: Diagnosis not present

## 2022-10-26 DIAGNOSIS — Z9089 Acquired absence of other organs: Secondary | ICD-10-CM | POA: Diagnosis not present

## 2022-10-26 DIAGNOSIS — R531 Weakness: Secondary | ICD-10-CM | POA: Diagnosis not present

## 2022-10-26 DIAGNOSIS — L929 Granulomatous disorder of the skin and subcutaneous tissue, unspecified: Secondary | ICD-10-CM | POA: Diagnosis not present

## 2022-10-26 DIAGNOSIS — Z9621 Cochlear implant status: Secondary | ICD-10-CM | POA: Diagnosis not present

## 2022-10-26 DIAGNOSIS — R269 Unspecified abnormalities of gait and mobility: Secondary | ICD-10-CM | POA: Diagnosis not present

## 2022-10-26 DIAGNOSIS — H95192 Other disorders following mastoidectomy, left ear: Secondary | ICD-10-CM | POA: Diagnosis not present

## 2022-10-26 DIAGNOSIS — H7012 Chronic mastoiditis, left ear: Secondary | ICD-10-CM | POA: Diagnosis not present

## 2022-11-01 DIAGNOSIS — I10 Essential (primary) hypertension: Secondary | ICD-10-CM | POA: Diagnosis not present

## 2022-11-01 DIAGNOSIS — D692 Other nonthrombocytopenic purpura: Secondary | ICD-10-CM | POA: Diagnosis not present

## 2022-11-01 DIAGNOSIS — Z299 Encounter for prophylactic measures, unspecified: Secondary | ICD-10-CM | POA: Diagnosis not present

## 2022-11-01 DIAGNOSIS — E1165 Type 2 diabetes mellitus with hyperglycemia: Secondary | ICD-10-CM | POA: Diagnosis not present

## 2022-11-01 DIAGNOSIS — I739 Peripheral vascular disease, unspecified: Secondary | ICD-10-CM | POA: Diagnosis not present

## 2022-11-13 DIAGNOSIS — Z9089 Acquired absence of other organs: Secondary | ICD-10-CM | POA: Diagnosis not present

## 2022-11-13 DIAGNOSIS — H7012 Chronic mastoiditis, left ear: Secondary | ICD-10-CM | POA: Diagnosis not present

## 2022-11-13 DIAGNOSIS — E119 Type 2 diabetes mellitus without complications: Secondary | ICD-10-CM | POA: Diagnosis not present

## 2022-11-13 DIAGNOSIS — L929 Granulomatous disorder of the skin and subcutaneous tissue, unspecified: Secondary | ICD-10-CM | POA: Diagnosis not present

## 2022-11-13 DIAGNOSIS — H903 Sensorineural hearing loss, bilateral: Secondary | ICD-10-CM | POA: Diagnosis not present

## 2022-11-13 DIAGNOSIS — H95192 Other disorders following mastoidectomy, left ear: Secondary | ICD-10-CM | POA: Diagnosis not present

## 2022-11-13 DIAGNOSIS — Z9621 Cochlear implant status: Secondary | ICD-10-CM | POA: Diagnosis not present

## 2023-01-30 ENCOUNTER — Other Ambulatory Visit: Payer: Self-pay | Admitting: Urology

## 2023-01-30 DIAGNOSIS — N138 Other obstructive and reflux uropathy: Secondary | ICD-10-CM

## 2023-01-30 NOTE — Telephone Encounter (Signed)
I spoke with patient's wife and she confirmed that yes, he is still taking Tamsulosin rx

## 2023-01-30 NOTE — Telephone Encounter (Signed)
It looks like he had issues with low bp and you recommended him d/c tamsulosin and start Rapaflo.  Please see telephone encounter for 08/03.

## 2023-02-08 DIAGNOSIS — Z299 Encounter for prophylactic measures, unspecified: Secondary | ICD-10-CM | POA: Diagnosis not present

## 2023-02-08 DIAGNOSIS — E1165 Type 2 diabetes mellitus with hyperglycemia: Secondary | ICD-10-CM | POA: Diagnosis not present

## 2023-02-08 DIAGNOSIS — I10 Essential (primary) hypertension: Secondary | ICD-10-CM | POA: Diagnosis not present

## 2023-02-12 DIAGNOSIS — H903 Sensorineural hearing loss, bilateral: Secondary | ICD-10-CM | POA: Diagnosis not present

## 2023-02-12 DIAGNOSIS — Z9621 Cochlear implant status: Secondary | ICD-10-CM | POA: Diagnosis not present

## 2023-02-12 DIAGNOSIS — H95192 Other disorders following mastoidectomy, left ear: Secondary | ICD-10-CM | POA: Diagnosis not present

## 2023-02-12 DIAGNOSIS — Z9089 Acquired absence of other organs: Secondary | ICD-10-CM | POA: Diagnosis not present

## 2023-02-12 DIAGNOSIS — H6121 Impacted cerumen, right ear: Secondary | ICD-10-CM | POA: Diagnosis not present

## 2023-02-14 ENCOUNTER — Encounter: Payer: Self-pay | Admitting: Urology

## 2023-02-14 ENCOUNTER — Ambulatory Visit (INDEPENDENT_AMBULATORY_CARE_PROVIDER_SITE_OTHER): Payer: Medicare Other | Admitting: Urology

## 2023-02-14 VITALS — BP 120/66 | HR 67 | Ht 71.0 in | Wt 172.0 lb

## 2023-02-14 DIAGNOSIS — R351 Nocturia: Secondary | ICD-10-CM

## 2023-02-14 DIAGNOSIS — N138 Other obstructive and reflux uropathy: Secondary | ICD-10-CM

## 2023-02-14 DIAGNOSIS — R3129 Other microscopic hematuria: Secondary | ICD-10-CM | POA: Diagnosis not present

## 2023-02-14 DIAGNOSIS — N401 Enlarged prostate with lower urinary tract symptoms: Secondary | ICD-10-CM

## 2023-02-14 DIAGNOSIS — N3941 Urge incontinence: Secondary | ICD-10-CM | POA: Diagnosis not present

## 2023-02-14 LAB — URINALYSIS, ROUTINE W REFLEX MICROSCOPIC
Bilirubin, UA: NEGATIVE
Glucose, UA: NEGATIVE
Ketones, UA: NEGATIVE
Leukocytes,UA: NEGATIVE
Nitrite, UA: NEGATIVE
Protein,UA: NEGATIVE
RBC, UA: NEGATIVE
Specific Gravity, UA: 1.01 (ref 1.005–1.030)
Urobilinogen, Ur: 0.2 mg/dL (ref 0.2–1.0)
pH, UA: 6 (ref 5.0–7.5)

## 2023-02-14 LAB — BLADDER SCAN AMB NON-IMAGING: Scan Result: 0

## 2023-02-14 MED ORDER — TAMSULOSIN HCL 0.4 MG PO CAPS
ORAL_CAPSULE | ORAL | 3 refills | Status: AC
Start: 2023-02-14 — End: ?

## 2023-02-14 MED ORDER — GEMTESA 75 MG PO TABS: 75.0000 mg | ORAL_TABLET | Freq: Every day | ORAL | 0 refills | Status: DC

## 2023-02-14 MED ORDER — DUTASTERIDE 0.5 MG PO CAPS: 0.5000 mg | ORAL_CAPSULE | Freq: Every morning | ORAL | 3 refills | Status: DC

## 2023-02-14 NOTE — Progress Notes (Signed)
post void residual=0 ?

## 2023-02-14 NOTE — Progress Notes (Signed)
Subjective:  1. BPH with urinary obstruction   2. Urge incontinence   3. Nocturia      I have symptoms of an enlarged prostate. HPI: Blake George is a 89 year-old male established patient who is here for symptoms of enlarged prostate.    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   He has been on tamsulosin and dutasteride His IPSS is 15 with nocturia x 4.  He is off of Metformin because of GI upset.    His PVR is 0ml.   He has had a prior elevated PSA but it was 1.4 in 12/20 which is the last we have. It was 7 prior to treatment.   He has had no gross hematuria but he does have a history of chronic microhematuria but his UA is clear today. He has no associated signs or symptoms.   IPSS Questionnaire (AUA-7): Over the past month.   1)  How often have you had a sensation of not emptying your bladder completely after you finish urinating?  3 - About half the time  2)  How often have you had to urinate again less than two hours after you finished urinating? 4 - More than half the time  3)  How often have you found you stopped and started again several times when you urinated?  0 - Not at all  4) How difficult have you found it to postpone urination?  4 - More than half the time  5) How often have you had a weak urinary stream?  0 - Not at all  6) How often have you had to push or strain to begin urination?  0 - Not at all  7) How many times did you most typically get up to urinate from the time you went to bed until the time you got up in the morning?  4 - 4 times  Total score:  0-7 mildly symptomatic   8-19 moderately symptomatic   20-35 severely symptomatic       ROS:  ROS:  A complete review of systems was performed.  All systems are negative except for pertinent findings as noted.   Review of Systems  HENT:  Positive for congestion.   Respiratory:  Positive for  shortness of breath.   Neurological:  Positive for weakness.  Endo/Heme/Allergies:  Bruises/bleeds easily.  All other systems reviewed and are negative.   Allergies  Allergen Reactions   Diamode [Loperamide Hcl]     Reaction unknown   Ivp Dye [Iodinated Contrast Media]     Reaction unknown   Morphine And Codeine     Reaction unknown   Topiramate Other (See Comments)    Reaction unknown Reaction unknown   Ciprofloxacin Other (See Comments)    Loss of appetite, problems sleeping   Zetia [Ezetimibe]     "family does not remember"   Bacitra-Neomycin-Polymyxin-Hc Anxiety    Had anxiety and insomnia with cortisporin otic drops.    Clobetasol Propionate Rash   Ofloxacin Anxiety    Caused anxiety and insomnia   Plavix [Clopidogrel Bisulfate]     Reaction unknown    Outpatient Encounter Medications as of 02/14/2023  Medication Sig   cyanocobalamin 1000 MCG tablet Take by mouth.   dipyridamole-aspirin (AGGRENOX) 200-25 MG per 12 hr capsule Take 1 capsule by mouth 2 (two) times daily.   glipiZIDE (GLUCOTROL XL)  2.5 MG 24 hr tablet Take 2.5 mg by mouth daily with breakfast.   glucose blood (TRUETRACK TEST) test strip    loratadine (CLARITIN REDITABS) 10 MG dissolvable tablet Take 10 mg by mouth daily.   Multiple Vitamins-Minerals (MULTIVITAMIN PO) Take 1 tablet by mouth daily.    rosuvastatin (CRESTOR) 5 MG tablet Take 5 mg by mouth daily.   Vibegron (GEMTESA) 75 MG TABS Take 1 tablet (75 mg total) by mouth daily.   [DISCONTINUED] dutasteride (AVODART) 0.5 MG capsule Take 1 capsule (0.5 mg total) by mouth every morning.   dutasteride (AVODART) 0.5 MG capsule Take 1 capsule (0.5 mg total) by mouth every morning.   tamsulosin (FLOMAX) 0.4 MG CAPS capsule TAKE 1 CAPSULE BY MOUTH DAILY AFTER SUPPER   [DISCONTINUED] lisinopril (ZESTRIL) 2.5 MG tablet Take 2.5 mg by mouth daily. (Patient not taking: Reported on 02/14/2023)   [DISCONTINUED] metFORMIN (GLUCOPHAGE) 500 MG tablet Take by mouth.  (Patient not taking: Reported on 02/14/2023)   [DISCONTINUED] metFORMIN (GLUCOPHAGE-XR) 500 MG 24 hr tablet Take 500 mg by mouth 2 (two) times daily. (Patient not taking: Reported on 02/14/2023)   [DISCONTINUED] silodosin (RAPAFLO) 4 MG CAPS capsule Take 1 capsule (4 mg total) by mouth daily with breakfast. (Patient not taking: Reported on 02/14/2023)   [DISCONTINUED] tamsulosin (FLOMAX) 0.4 MG CAPS capsule TAKE 1 CAPSULE BY MOUTH DAILY AFTER SUPPER (Patient not taking: Reported on 02/14/2023)   No facility-administered encounter medications on file as of 02/14/2023.    Past Medical History:  Diagnosis Date   Arthritis    "lower back" (06/10/2013)   Basal cell carcinoma    Right lower leg   Cochlear implant in place    bilaterally   Dizziness    episodic   Enlarged prostate    Gait disturbance    Headache(784.0)    Hearing deficit    deaf Left ear   Hearing loss    Cochlear implant   History of blood transfusion 2001   "probably, related to abdominal OR" (06/10/2013)   Hypertrophic acne scar    benign   Hypertrophy (benign) of prostate    benign prostatic   Leg lesion    Melanoma ([facility])    left retinal; "took radiation for it ~ 2 yr ago" (06/10/2013)   Pancreatic adenoma    status post resection   Pulmonary embolism ([facility]) 2001   "after big stomach OR; in hospital for 78 days" (06/10/2013)   Renal insufficiency    chronic   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 ([facility])    Squamous cell carcinoma, leg    RLE/notes 06/10/2013   Stroke ([facility]) ~ 2004; ~ 2012   auditory nerve,cerebellar artery stroke, left superior cerebellar artery; "that's when I lost my hearing" (06/10/2013)   Type II diabetes mellitus ([facility])    Vertigo    no problem past 2 yrs   Visual hallucinations 09/29/2020    Past Surgical History:  Procedure Laterality Date   ABDOMINAL SURGERY  2001   Partial colectomy/partial pancreatectomy / spenectomy   CATARACT EXTRACTION Left    COCHLEAR IMPLANT  Bilateral 2006 / 2013   COCHLEAR IMPLANT REMOVAL Left 12/01/2019   COLECTOMY  2001   partial   LESION REMOVAL Right 06/05/2013   Procedure: LESION REMOVAL RIGHT LOWER LEG ;  Surgeon: Shelly Rubenstein, MD;  Location: [facility];  Service: General;  Laterality: Right;   MELANOMA EXCISION Left    Resected, left retina   PANCREATECTOMY  2001   partial  SPLENECTOMY, TOTAL  2001    Social History   Socioeconomic History   Marital status: Married    Spouse name: Eloise   Number of children: Not on file   Years of education: Not on file   Highest education level: Not on file  Occupational History   Not on file  Tobacco Use   Smoking status: Former    Packs/day: 0.12    Years: 20.00    Additional pack years: 0.00    Total pack years: 2.40    Types: Cigarettes    Quit date: 05/29/1974    Years since quitting: 48.7   Smokeless tobacco: Never  Substance and Sexual Activity   Alcohol use: No   Drug use: No   Sexual activity: Never  Other Topics Concern   Not on file  Social History Narrative   Lives with Wife   Left Handed   Drinks no caffeine   Social Determinants of Health   Financial Resource Strain: Not on file  Food Insecurity: Not on file  Transportation Needs: Not on file  Physical Activity: Not on file  Stress: Not on file  Social Connections: Not on file  Intimate Partner Violence: Not on file    Family History  Problem Relation Age of Onset   Heart attack Mother    Diabetes Mother    Stroke Father    Heart disease Brother    Diabetes Brother    Cancer - Prostate Brother        Objective: Vitals:   02/14/23 0916  BP: 120/66  Pulse: 67     Physical Exam Vitals reviewed.  Constitutional:      Appearance: Normal appearance.  Neurological:     Mental Status: He is alert.     Lab Results:  Results for orders placed or performed in visit on 02/14/23 (from the past 24 hour(s))  Urinalysis, Routine w reflex microscopic     Status: None    Collection Time: 02/14/23  9:11 AM  Result Value Ref Range   Specific Gravity, UA 1.010 1.005 - 1.030   pH, UA 6.0 5.0 - 7.5   Color, UA Yellow Yellow   Appearance Ur Clear Clear   Leukocytes,UA Negative Negative   Protein,UA Negative Negative/Trace   Glucose, UA Negative Negative   Ketones, UA Negative Negative   RBC, UA Negative Negative   Bilirubin, UA Negative Negative   Urobilinogen, Ur 0.2 0.2 - 1.0 mg/dL   Nitrite, UA Negative Negative   Microscopic Examination Comment    Narrative   Performed at:  [address] - [facility] [address], [place], [place]  [id number] Lab Director: Chinita Pester MT, Phone:  [phone]    UA is clear.  BMET No results for input(s): "NA", "K", "CL", "CO2", "GLUCOSE", "BUN", "CREATININE", "CALCIUM" in the last 72 hours. PSA PSA  Date Value Ref Range Status  08/25/2019 1.4 < OR = 4.0 ng/mL Final    Comment:    The total PSA value from this assay system is  standardized against the WHO standard. The test  result will be approximately 20% lower when compared  to the equimolar-standardized total PSA (Beckman  Coulter). Comparison of serial PSA results should be  interpreted with this fact in mind. . This test was performed using the Siemens  chemiluminescent method. Values obtained from  different assay methods cannot be used interchangeably. PSA levels, regardless of value, should not be interpreted as absolute evidence of the presence or absence of disease.  No results found for: "TESTOSTERONE"    Studies/Results: UA is clear.  PVR is 0ml.    Assessment & Plan: He has increased  nocturia and urgency  with mild UUI and  BPH and BOO. He will remains on tamsulosin and dutasteride.   I will add Gemtesa and have him return in a month for a PVR.   Elevated PSA was well suppressed on dutasteride at last check in 12/20.  He doesn't need further testing.    Microhematuria.   UA clear today,.   Meds ordered this encounter   Medications   tamsulosin (FLOMAX) 0.4 MG CAPS capsule    Sig: TAKE 1 CAPSULE BY MOUTH DAILY AFTER SUPPER    Dispense:  90 capsule    Refill:  3   dutasteride (AVODART) 0.5 MG capsule    Sig: Take 1 capsule (0.5 mg total) by mouth every morning.    Dispense:  90 capsule    Refill:  3   Vibegron (GEMTESA) 75 MG TABS    Sig: Take 1 tablet (75 mg total) by mouth daily.    Dispense:  42 tablet    Refill:  0     Orders Placed This Encounter  Procedures   Urinalysis, Routine w reflex microscopic   BLADDER SCAN AMB NON-IMAGING      Return in about 4 weeks (around 03/14/2023) for with Sarah for a PVR.Marland Kitchen   CC: Ignatius Specking, MD      Bjorn Pippin 02/15/2023

## 2023-03-07 ENCOUNTER — Telehealth: Payer: Self-pay

## 2023-03-07 NOTE — Telephone Encounter (Signed)
Patient checking if he will need to come to July 18th appointment.  Stopped taking  Vibegron (GEMTESA) 75 MG TABS after 1 week due to constipation and did not help with frequent urination.  Please advise.

## 2023-03-07 NOTE — Telephone Encounter (Signed)
Wife is aware to keep apt as scheduled.

## 2023-03-13 DIAGNOSIS — H903 Sensorineural hearing loss, bilateral: Secondary | ICD-10-CM | POA: Insufficient documentation

## 2023-03-13 NOTE — Progress Notes (Signed)
Name: Blake George DOB: 10-16-1927 MRN: [id number]  History of Present Illness: Blake George is a 89 y.o. male who presents today for return patient visit at [facility] [place]. He is accompanied by his wife Blake George. - GU history: 1. BPH with BOO and weak stream. - Taking Flomax and Avodart. 2. Elevated PSA. - He reports family history of prostate cancer. - Per prior note by Dr. Annabell Howells: "He has had a prior elevated PSA but it was 1.4 in 12/20 which is the last we have. It was 7 prior to treatment." No further PSA testing advised. 3. OAB with urinary frequency, urgency, and urge incontinence. 4. Chronic microscopic hematuria.  At last visit with Dr. Annabell Howells on 02/14/2023: - Reported increased nocturia, urgency, and mild UUI.  - The plan was to continue Flomax and Avodart; start Gemtesa.  - PVR = 0 ml. - Normal UA.   Today: He states that he took Gemtesa 75 mg daily for 1 week but didn't have any  notable improvement with his nocturia plus it seemed to be more difficult to void, so he discontinued the medication. He continues to void almost hourly overnight and is able to get right back to sleep.   Today He denies dysuria, gross hematuria, hesitancy, straining to void, or sensations of incomplete emptying.  He denies history of OSA; has never had a sleep study. He denies fluid intake within 3 hours prior to bedtime. He denies fluid intake during the night.  He denies caffeine intake within 8 hours prior to bedtime. He reports routinely experiencing lower extremity edema in both ankles during the day; not taking any diuretics. He reports elevating feet during the day when sedentary. Denies wearing compression socks.   Fall Screening: Do you usually have a device to assist in your mobility? Yes - walker   Medications: Current Outpatient Medications  Medication Sig Dispense Refill   cyanocobalamin 1000 MCG tablet Take by mouth.     dipyridamole-aspirin (AGGRENOX) 200-25 MG  per 12 hr capsule Take 1 capsule by mouth 2 (two) times daily. 60 capsule 1   dutasteride (AVODART) 0.5 MG capsule Take 1 capsule (0.5 mg total) by mouth every morning. 90 capsule 3   glipiZIDE (GLUCOTROL XL) 2.5 MG 24 hr tablet Take 2.5 mg by mouth daily with breakfast.     glucose blood (TRUETRACK TEST) test strip      loratadine (CLARITIN REDITABS) 10 MG dissolvable tablet Take 10 mg by mouth daily.     Multiple Vitamins-Minerals (MULTIVITAMIN PO) Take 1 tablet by mouth daily.      rosuvastatin (CRESTOR) 5 MG tablet Take 5 mg by mouth daily.     tamsulosin (FLOMAX) 0.4 MG CAPS capsule TAKE 1 CAPSULE BY MOUTH DAILY AFTER SUPPER 90 capsule 3   No current facility-administered medications for this visit.    Allergies: Allergies  Allergen Reactions   Diamode [Loperamide Hcl]     Reaction unknown   Ivp Dye [Iodinated Contrast Media]     Reaction unknown   Morphine And Codeine     Reaction unknown   Topiramate Other (See Comments)    Reaction unknown Reaction unknown   Ciprofloxacin Other (See Comments)    Loss of appetite, problems sleeping   Zetia [Ezetimibe]     "family does not remember"   Bacitra-Neomycin-Polymyxin-Hc Anxiety    Had anxiety and insomnia with cortisporin otic drops.    Clobetasol Propionate Rash   Ofloxacin Anxiety    Caused anxiety and insomnia  Plavix [Clopidogrel Bisulfate]     Reaction unknown    Past Medical History:  Diagnosis Date   Arthritis    "lower back" (06/10/2013)   Basal cell carcinoma    Right lower leg   Cochlear implant in place    bilaterally   Dizziness    episodic   Enlarged prostate    Gait disturbance    Headache(784.0)    Hearing deficit    deaf Left ear   Hearing loss    Cochlear implant   History of blood transfusion 2001   "probably, related to abdominal OR" (06/10/2013)   Hypertrophic acne scar    benign   Hypertrophy (benign) of prostate    benign prostatic   Leg lesion    Melanoma (HCC)    left retinal;  "took radiation for it ~ 2 yr ago" (06/10/2013)   Pancreatic adenoma    status post resection   Pulmonary embolism (HCC) 2001   "after big stomach OR; in hospital for 78 days" (06/10/2013)   Renal insufficiency    chronic   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 (HCC)    Squamous cell carcinoma, leg    RLE/notes 06/10/2013   Stroke (HCC) ~ 2004; ~ 2012   auditory nerve,cerebellar artery stroke, left superior cerebellar artery; "that's when I lost my hearing" (06/10/2013)   Type II diabetes mellitus (HCC)    Vertigo    no problem past 2 yrs   Visual hallucinations 09/29/2020   Past Surgical History:  Procedure Laterality Date   ABDOMINAL SURGERY  2001   Partial colectomy/partial pancreatectomy / spenectomy   CATARACT EXTRACTION Left    COCHLEAR IMPLANT Bilateral 2006 / 2013   COCHLEAR IMPLANT REMOVAL Left 12/01/2019   COLECTOMY  2001   partial   LESION REMOVAL Right 06/05/2013   Procedure: LESION REMOVAL RIGHT LOWER LEG ;  Surgeon: Shelly Rubenstein, MD;  Location: [facility];  Service: General;  Laterality: Right;   MELANOMA EXCISION Left    Resected, left retina   PANCREATECTOMY  2001   partial   SPLENECTOMY, TOTAL  2001   Family History  Problem Relation Age of Onset   Heart attack Mother    Diabetes Mother    Stroke Father    Heart disease Brother    Diabetes Brother    Cancer - Prostate Brother    Social History   Socioeconomic History   Marital status: Married    Spouse name: Blake George   Number of children: Not on file   Years of education: Not on file   Highest education level: Not on file  Occupational History   Not on file  Tobacco Use   Smoking status: Former    Current packs/day: 0.00    Average packs/day: 0.1 packs/day for 20.0 years (2.4 ttl pk-yrs)    Types: Cigarettes    Start date: 05/29/1954    Quit date: 05/29/1974    Years since quitting: 48.8   Smokeless tobacco: Never  Substance and Sexual Activity   Alcohol use: No   Drug use: No    Sexual activity: Never  Other Topics Concern   Not on file  Social History Narrative   Lives with Wife   Left Handed   Drinks no caffeine   Social Determinants of Health   Financial Resource Strain: Not on file  Food Insecurity: Not on file  Transportation Needs: Not on file  Physical Activity: Not on file  Stress: Not on file  Social Connections:  Not on file  Intimate Partner Violence: Not on file    Review of Systems Constitutional: Patient denies any unintentional weight loss or change in strength lntegumentary: Patient denies any rashes or pruritus Cardiovascular: Patient denies chest pain or syncope Respiratory: Patient denies shortness of breath Gastrointestinal: Patient denies nausea, vomiting, constipation, or diarrhea Musculoskeletal: Patient denies muscle cramps or weakness Neurologic: Patient denies convulsions or seizures Psychiatric: Patient denies memory problems Allergic/Immunologic: Patient denies recent allergic reaction(s) Hematologic/Lymphatic: Patient denies bleeding tendencies Endocrine: Patient denies heat/cold intolerance  GU: As per HPI.  OBJECTIVE Vitals:   03/14/23 1010  BP: (!) 86/42  Pulse: (!) 52  Temp: 98.5 F (36.9 C)   There is no height or weight on file to calculate BMI.  Physical Examination  Constitutional: No obvious distress; patient is non-toxic appearing  Cardiovascular: No visible lower extremity edema.  Respiratory: The patient does not have audible wheezing/stridor; respirations do not appear labored  Gastrointestinal: Abdomen non-distended Musculoskeletal: Normal ROM of UEs  Skin: No obvious rashes/open sores  Neurologic: CN 2-12 grossly intact Psychiatric: Answered questions appropriately with normal affect  Hematologic/Lymphatic/Immunologic: No obvious bruises or sites of spontaneous bleeding  UA: negative for  PVR: 30 ml  ASSESSMENT Nocturia - Plan: BLADDER SCAN AMB NON-IMAGING, Urinalysis, Routine w reflex  microscopic  Urge incontinence - Plan: BLADDER SCAN AMB NON-IMAGING, Urinalysis, Routine w reflex microscopic  Benign non-nodular prostatic hyperplasia with lower urinary tract symptoms  Elevated prostate specific antigen (PSA)  Family history of malignant neoplasm of prostate  Urinary urgency  Weak urinary stream  Bilateral lower extremity edema  We discussed the symptoms of overactive bladder (OAB), which include urinary urgency, frequency, nocturia, with or without urge incontinence.   While we may not know the exact etiology of OAB, several risk factors can be identified. He has neurogenic risk factors for OAB-type symptoms including T2DM and prior stroke.   We discussed the following management options in detail including potential benefits, risks, and side effects: Behavioral therapy: Decreasing bladder irritants (such as caffeine, acidic foods, spicy foods, alcohol) Bladder retraining / timed voiding Medication(s): Anticholinergic medications: Not recommended due to side effect risk.  Beta-3 adrenergic agonist medications: Gemtesa discontinued as per patient due to inefficacy and side effect of increased difficulty voiding.   For nocturia specifically: We discussed options for further evaluation including a sleep study to evaluate for obstructive sleep apnea, which patient was informed can contribute to nocturia and/or nocturnal polyuria. We reviewed management options including: Behavioral changes: Minimizing caffeine intake (especially within 6-8 hours before bedtime). Minimizing fluid intake within 3 hours before bedtime. Minimizing overnight fluid intake. If bilateral lower extremity edema is present: Elevating feet during the day and/or wearing compression socks. Medications: Consult with PCP regarding possible use of a daily diuretic to offload fluid.  Oxybutynin IR 5 mg nightly. May consider this at f/u in 3 months if symptoms fail to improve.  Will plan for  follow up in 3 months or sooner if needed. Pt verbalized understanding and agreement. All questions were answered.  PLAN Advised the following: 1. Continue Flomax and Avodart. 2. Continue avoiding caffeine. 3. Continue with minimal evening / overnight fluid intake. 4. Elevate legs when sedentary. 5. Patient advised to discuss bilateral lower extremity edema and possible sleep study with PCP. 6. Return in about 3 months (around 06/14/2023) for UA, PVR, & f/u with Evette Georges NP.  Orders Placed This Encounter  Procedures   Urinalysis, Routine w reflex microscopic   BLADDER SCAN AMB NON-IMAGING  Total time spent caring for the patient today was over 30 minutes. This includes time spent on the date of the visit reviewing the patient's chart before the visit, time spent during the visit, and time spent after the visit on documentation. Over 50% of that time was spent in face-to-face time with this patient for direct counseling. E&M based on time and complexity of medical decision making.  It has been explained that the patient is to follow regularly with their PCP in addition to all other providers involved in their care and to follow instructions provided by these respective offices. Patient advised to contact urology clinic if any urologic-pertaining questions, concerns, new symptoms or problems arise in the interim period.  There are no Patient Instructions on file for this visit.  Electronically signed by:  Donnita Falls, FNP   03/14/23    11:30 AM

## 2023-03-14 ENCOUNTER — Ambulatory Visit (INDEPENDENT_AMBULATORY_CARE_PROVIDER_SITE_OTHER): Payer: Medicare Other | Admitting: Urology

## 2023-03-14 ENCOUNTER — Encounter: Payer: Self-pay | Admitting: Urology

## 2023-03-14 VITALS — BP 86/42 | HR 52 | Temp 98.5°F

## 2023-03-14 DIAGNOSIS — R3912 Poor urinary stream: Secondary | ICD-10-CM

## 2023-03-14 DIAGNOSIS — N3941 Urge incontinence: Secondary | ICD-10-CM | POA: Diagnosis not present

## 2023-03-14 DIAGNOSIS — R351 Nocturia: Secondary | ICD-10-CM

## 2023-03-14 DIAGNOSIS — Z8042 Family history of malignant neoplasm of prostate: Secondary | ICD-10-CM

## 2023-03-14 DIAGNOSIS — R600 Localized edema: Secondary | ICD-10-CM

## 2023-03-14 DIAGNOSIS — N138 Other obstructive and reflux uropathy: Secondary | ICD-10-CM | POA: Diagnosis not present

## 2023-03-14 DIAGNOSIS — R3915 Urgency of urination: Secondary | ICD-10-CM

## 2023-03-14 DIAGNOSIS — N401 Enlarged prostate with lower urinary tract symptoms: Secondary | ICD-10-CM

## 2023-03-14 DIAGNOSIS — R972 Elevated prostate specific antigen [PSA]: Secondary | ICD-10-CM

## 2023-03-14 LAB — URINALYSIS, ROUTINE W REFLEX MICROSCOPIC
Bilirubin, UA: NEGATIVE
Glucose, UA: NEGATIVE
Ketones, UA: NEGATIVE
Leukocytes,UA: NEGATIVE
Nitrite, UA: NEGATIVE
Protein,UA: NEGATIVE
RBC, UA: NEGATIVE
Specific Gravity, UA: 1.015 (ref 1.005–1.030)
Urobilinogen, Ur: 0.2 mg/dL (ref 0.2–1.0)
pH, UA: 6 (ref 5.0–7.5)

## 2023-03-14 LAB — BLADDER SCAN AMB NON-IMAGING: Scan Result: 30

## 2023-04-24 DIAGNOSIS — Z125 Encounter for screening for malignant neoplasm of prostate: Secondary | ICD-10-CM | POA: Diagnosis not present

## 2023-04-24 DIAGNOSIS — Z7189 Other specified counseling: Secondary | ICD-10-CM | POA: Diagnosis not present

## 2023-04-24 DIAGNOSIS — R5383 Other fatigue: Secondary | ICD-10-CM | POA: Diagnosis not present

## 2023-04-24 DIAGNOSIS — Z0000 Encounter for general adult medical examination without abnormal findings: Secondary | ICD-10-CM | POA: Diagnosis not present

## 2023-04-24 DIAGNOSIS — I10 Essential (primary) hypertension: Secondary | ICD-10-CM | POA: Diagnosis not present

## 2023-04-24 DIAGNOSIS — Z1331 Encounter for screening for depression: Secondary | ICD-10-CM | POA: Diagnosis not present

## 2023-04-24 DIAGNOSIS — Z79899 Other long term (current) drug therapy: Secondary | ICD-10-CM | POA: Diagnosis not present

## 2023-04-24 DIAGNOSIS — Z1339 Encounter for screening examination for other mental health and behavioral disorders: Secondary | ICD-10-CM | POA: Diagnosis not present

## 2023-04-24 DIAGNOSIS — E7800 Pure hypercholesterolemia, unspecified: Secondary | ICD-10-CM | POA: Diagnosis not present

## 2023-04-24 DIAGNOSIS — Z299 Encounter for prophylactic measures, unspecified: Secondary | ICD-10-CM | POA: Diagnosis not present

## 2023-05-15 DIAGNOSIS — Z9089 Acquired absence of other organs: Secondary | ICD-10-CM | POA: Diagnosis not present

## 2023-05-15 DIAGNOSIS — Q165 Congenital malformation of inner ear: Secondary | ICD-10-CM | POA: Diagnosis not present

## 2023-05-15 DIAGNOSIS — H903 Sensorineural hearing loss, bilateral: Secondary | ICD-10-CM | POA: Diagnosis not present

## 2023-05-15 DIAGNOSIS — Z9621 Cochlear implant status: Secondary | ICD-10-CM | POA: Diagnosis not present

## 2023-05-15 DIAGNOSIS — H6121 Impacted cerumen, right ear: Secondary | ICD-10-CM | POA: Diagnosis not present

## 2023-05-15 DIAGNOSIS — H95192 Other disorders following mastoidectomy, left ear: Secondary | ICD-10-CM | POA: Diagnosis not present

## 2023-05-19 DIAGNOSIS — H903 Sensorineural hearing loss, bilateral: Secondary | ICD-10-CM | POA: Diagnosis not present

## 2023-05-29 DIAGNOSIS — N62 Hypertrophy of breast: Secondary | ICD-10-CM | POA: Diagnosis not present

## 2023-05-29 DIAGNOSIS — N61 Mastitis without abscess: Secondary | ICD-10-CM | POA: Diagnosis not present

## 2023-05-29 DIAGNOSIS — E1122 Type 2 diabetes mellitus with diabetic chronic kidney disease: Secondary | ICD-10-CM | POA: Diagnosis not present

## 2023-05-29 DIAGNOSIS — Z23 Encounter for immunization: Secondary | ICD-10-CM | POA: Diagnosis not present

## 2023-05-29 DIAGNOSIS — I10 Essential (primary) hypertension: Secondary | ICD-10-CM | POA: Diagnosis not present

## 2023-05-29 DIAGNOSIS — Z299 Encounter for prophylactic measures, unspecified: Secondary | ICD-10-CM | POA: Diagnosis not present

## 2023-06-04 DIAGNOSIS — Z299 Encounter for prophylactic measures, unspecified: Secondary | ICD-10-CM | POA: Diagnosis not present

## 2023-06-04 DIAGNOSIS — N61 Mastitis without abscess: Secondary | ICD-10-CM | POA: Diagnosis not present

## 2023-06-04 DIAGNOSIS — I10 Essential (primary) hypertension: Secondary | ICD-10-CM | POA: Diagnosis not present

## 2023-06-04 DIAGNOSIS — N6310 Unspecified lump in the right breast, unspecified quadrant: Secondary | ICD-10-CM | POA: Diagnosis not present

## 2023-06-10 DIAGNOSIS — N61 Mastitis without abscess: Secondary | ICD-10-CM | POA: Diagnosis not present

## 2023-06-17 ENCOUNTER — Ambulatory Visit: Payer: Medicare Other | Admitting: Urology

## 2023-06-26 DIAGNOSIS — N644 Mastodynia: Secondary | ICD-10-CM | POA: Diagnosis not present

## 2023-06-26 DIAGNOSIS — N61 Mastitis without abscess: Secondary | ICD-10-CM | POA: Diagnosis not present

## 2023-06-26 DIAGNOSIS — N62 Hypertrophy of breast: Secondary | ICD-10-CM | POA: Diagnosis not present

## 2023-06-26 DIAGNOSIS — N6310 Unspecified lump in the right breast, unspecified quadrant: Secondary | ICD-10-CM | POA: Diagnosis not present

## 2023-06-26 DIAGNOSIS — R92323 Mammographic fibroglandular density, bilateral breasts: Secondary | ICD-10-CM | POA: Diagnosis not present

## 2023-07-11 DIAGNOSIS — H47012 Ischemic optic neuropathy, left eye: Secondary | ICD-10-CM | POA: Diagnosis not present

## 2023-07-11 DIAGNOSIS — C6932 Malignant neoplasm of left choroid: Secondary | ICD-10-CM | POA: Diagnosis not present

## 2023-07-11 DIAGNOSIS — Z961 Presence of intraocular lens: Secondary | ICD-10-CM | POA: Diagnosis not present

## 2023-07-11 DIAGNOSIS — E119 Type 2 diabetes mellitus without complications: Secondary | ICD-10-CM | POA: Diagnosis not present

## 2023-07-30 DIAGNOSIS — H6121 Impacted cerumen, right ear: Secondary | ICD-10-CM | POA: Diagnosis not present

## 2023-07-30 DIAGNOSIS — H903 Sensorineural hearing loss, bilateral: Secondary | ICD-10-CM | POA: Diagnosis not present

## 2023-07-30 DIAGNOSIS — Q165 Congenital malformation of inner ear: Secondary | ICD-10-CM | POA: Diagnosis not present

## 2023-07-30 DIAGNOSIS — Z9621 Cochlear implant status: Secondary | ICD-10-CM | POA: Diagnosis not present

## 2023-07-30 DIAGNOSIS — Z9089 Acquired absence of other organs: Secondary | ICD-10-CM | POA: Diagnosis not present

## 2023-07-30 DIAGNOSIS — H95192 Other disorders following mastoidectomy, left ear: Secondary | ICD-10-CM | POA: Diagnosis not present

## 2023-07-30 DIAGNOSIS — L219 Seborrheic dermatitis, unspecified: Secondary | ICD-10-CM | POA: Diagnosis not present

## 2023-08-17 DIAGNOSIS — S0990XA Unspecified injury of head, initial encounter: Secondary | ICD-10-CM | POA: Diagnosis not present

## 2023-08-17 DIAGNOSIS — W19XXXA Unspecified fall, initial encounter: Secondary | ICD-10-CM | POA: Diagnosis not present

## 2023-09-24 DIAGNOSIS — H26491 Other secondary cataract, right eye: Secondary | ICD-10-CM | POA: Diagnosis not present

## 2023-09-24 DIAGNOSIS — H52201 Unspecified astigmatism, right eye: Secondary | ICD-10-CM | POA: Diagnosis not present

## 2023-09-24 DIAGNOSIS — H3500 Unspecified background retinopathy: Secondary | ICD-10-CM | POA: Diagnosis not present

## 2023-09-24 DIAGNOSIS — E119 Type 2 diabetes mellitus without complications: Secondary | ICD-10-CM | POA: Diagnosis not present

## 2023-09-24 DIAGNOSIS — C6932 Malignant neoplasm of left choroid: Secondary | ICD-10-CM | POA: Diagnosis not present

## 2023-09-24 DIAGNOSIS — H524 Presbyopia: Secondary | ICD-10-CM | POA: Diagnosis not present

## 2023-09-24 DIAGNOSIS — H903 Sensorineural hearing loss, bilateral: Secondary | ICD-10-CM | POA: Diagnosis not present

## 2023-10-01 DIAGNOSIS — H26491 Other secondary cataract, right eye: Secondary | ICD-10-CM | POA: Diagnosis not present

## 2023-10-04 DIAGNOSIS — H903 Sensorineural hearing loss, bilateral: Secondary | ICD-10-CM | POA: Diagnosis not present

## 2023-10-10 DIAGNOSIS — Z299 Encounter for prophylactic measures, unspecified: Secondary | ICD-10-CM | POA: Diagnosis not present

## 2023-10-10 DIAGNOSIS — I10 Essential (primary) hypertension: Secondary | ICD-10-CM | POA: Diagnosis not present

## 2023-10-10 DIAGNOSIS — E1122 Type 2 diabetes mellitus with diabetic chronic kidney disease: Secondary | ICD-10-CM | POA: Diagnosis not present

## 2023-10-10 DIAGNOSIS — I152 Hypertension secondary to endocrine disorders: Secondary | ICD-10-CM | POA: Diagnosis not present

## 2023-10-10 DIAGNOSIS — E1159 Type 2 diabetes mellitus with other circulatory complications: Secondary | ICD-10-CM | POA: Diagnosis not present

## 2023-10-10 DIAGNOSIS — N1831 Chronic kidney disease, stage 3a: Secondary | ICD-10-CM | POA: Diagnosis not present

## 2023-10-10 DIAGNOSIS — G319 Degenerative disease of nervous system, unspecified: Secondary | ICD-10-CM | POA: Diagnosis not present

## 2023-10-10 DIAGNOSIS — I739 Peripheral vascular disease, unspecified: Secondary | ICD-10-CM | POA: Diagnosis not present

## 2023-10-29 DIAGNOSIS — H6121 Impacted cerumen, right ear: Secondary | ICD-10-CM | POA: Diagnosis not present

## 2023-10-29 DIAGNOSIS — Q165 Congenital malformation of inner ear: Secondary | ICD-10-CM | POA: Diagnosis not present

## 2023-10-29 DIAGNOSIS — Z9621 Cochlear implant status: Secondary | ICD-10-CM | POA: Diagnosis not present

## 2023-10-29 DIAGNOSIS — H903 Sensorineural hearing loss, bilateral: Secondary | ICD-10-CM | POA: Diagnosis not present

## 2023-10-29 DIAGNOSIS — Z9089 Acquired absence of other organs: Secondary | ICD-10-CM | POA: Diagnosis not present

## 2023-10-29 DIAGNOSIS — H95192 Other disorders following mastoidectomy, left ear: Secondary | ICD-10-CM | POA: Diagnosis not present

## 2023-10-29 DIAGNOSIS — L219 Seborrheic dermatitis, unspecified: Secondary | ICD-10-CM | POA: Diagnosis not present

## 2023-11-06 DIAGNOSIS — E1122 Type 2 diabetes mellitus with diabetic chronic kidney disease: Secondary | ICD-10-CM | POA: Diagnosis not present

## 2023-11-06 DIAGNOSIS — I10 Essential (primary) hypertension: Secondary | ICD-10-CM | POA: Diagnosis not present

## 2023-11-06 DIAGNOSIS — Z299 Encounter for prophylactic measures, unspecified: Secondary | ICD-10-CM | POA: Diagnosis not present

## 2023-11-06 DIAGNOSIS — D692 Other nonthrombocytopenic purpura: Secondary | ICD-10-CM | POA: Diagnosis not present

## 2023-11-06 DIAGNOSIS — R600 Localized edema: Secondary | ICD-10-CM | POA: Diagnosis not present

## 2023-11-14 DIAGNOSIS — I10 Essential (primary) hypertension: Secondary | ICD-10-CM | POA: Diagnosis not present

## 2023-11-14 DIAGNOSIS — N1831 Chronic kidney disease, stage 3a: Secondary | ICD-10-CM | POA: Diagnosis not present

## 2023-11-14 DIAGNOSIS — L84 Corns and callosities: Secondary | ICD-10-CM | POA: Diagnosis not present

## 2023-11-14 DIAGNOSIS — Z299 Encounter for prophylactic measures, unspecified: Secondary | ICD-10-CM | POA: Diagnosis not present

## 2023-11-14 DIAGNOSIS — R600 Localized edema: Secondary | ICD-10-CM | POA: Diagnosis not present

## 2023-11-19 DIAGNOSIS — L84 Corns and callosities: Secondary | ICD-10-CM | POA: Diagnosis not present

## 2023-11-19 DIAGNOSIS — B351 Tinea unguium: Secondary | ICD-10-CM | POA: Diagnosis not present

## 2023-11-19 DIAGNOSIS — M79676 Pain in unspecified toe(s): Secondary | ICD-10-CM | POA: Diagnosis not present

## 2023-11-19 DIAGNOSIS — E1142 Type 2 diabetes mellitus with diabetic polyneuropathy: Secondary | ICD-10-CM | POA: Diagnosis not present

## 2023-11-20 DIAGNOSIS — Z45321 Encounter for adjustment and management of cochlear device: Secondary | ICD-10-CM | POA: Diagnosis not present

## 2023-12-04 ENCOUNTER — Other Ambulatory Visit: Payer: Self-pay | Admitting: Urology

## 2023-12-04 DIAGNOSIS — N138 Other obstructive and reflux uropathy: Secondary | ICD-10-CM

## 2023-12-11 DIAGNOSIS — H903 Sensorineural hearing loss, bilateral: Secondary | ICD-10-CM | POA: Diagnosis not present

## 2024-01-05 DIAGNOSIS — E119 Type 2 diabetes mellitus without complications: Secondary | ICD-10-CM | POA: Diagnosis not present

## 2024-01-05 DIAGNOSIS — E860 Dehydration: Secondary | ICD-10-CM | POA: Diagnosis not present

## 2024-01-05 DIAGNOSIS — N400 Enlarged prostate without lower urinary tract symptoms: Secondary | ICD-10-CM | POA: Diagnosis not present

## 2024-01-05 DIAGNOSIS — R7989 Other specified abnormal findings of blood chemistry: Secondary | ICD-10-CM | POA: Diagnosis not present

## 2024-01-05 DIAGNOSIS — I440 Atrioventricular block, first degree: Secondary | ICD-10-CM | POA: Diagnosis not present

## 2024-01-05 DIAGNOSIS — I4510 Unspecified right bundle-branch block: Secondary | ICD-10-CM | POA: Diagnosis not present

## 2024-01-05 DIAGNOSIS — R531 Weakness: Secondary | ICD-10-CM | POA: Diagnosis not present

## 2024-01-05 DIAGNOSIS — R410 Disorientation, unspecified: Secondary | ICD-10-CM | POA: Diagnosis not present

## 2024-01-05 DIAGNOSIS — R4182 Altered mental status, unspecified: Secondary | ICD-10-CM | POA: Diagnosis not present

## 2024-01-05 DIAGNOSIS — I10 Essential (primary) hypertension: Secondary | ICD-10-CM | POA: Diagnosis not present

## 2024-01-05 DIAGNOSIS — R791 Abnormal coagulation profile: Secondary | ICD-10-CM | POA: Diagnosis not present

## 2024-01-05 DIAGNOSIS — I454 Nonspecific intraventricular block: Secondary | ICD-10-CM | POA: Diagnosis not present

## 2024-01-05 DIAGNOSIS — Z87891 Personal history of nicotine dependence: Secondary | ICD-10-CM | POA: Diagnosis not present

## 2024-01-05 DIAGNOSIS — R404 Transient alteration of awareness: Secondary | ICD-10-CM | POA: Diagnosis not present

## 2024-01-08 DIAGNOSIS — Z299 Encounter for prophylactic measures, unspecified: Secondary | ICD-10-CM | POA: Diagnosis not present

## 2024-01-08 DIAGNOSIS — N1831 Chronic kidney disease, stage 3a: Secondary | ICD-10-CM | POA: Diagnosis not present

## 2024-01-08 DIAGNOSIS — I10 Essential (primary) hypertension: Secondary | ICD-10-CM | POA: Diagnosis not present

## 2024-01-08 DIAGNOSIS — Z8673 Personal history of transient ischemic attack (TIA), and cerebral infarction without residual deficits: Secondary | ICD-10-CM | POA: Diagnosis not present

## 2024-01-28 DIAGNOSIS — E1142 Type 2 diabetes mellitus with diabetic polyneuropathy: Secondary | ICD-10-CM | POA: Diagnosis not present

## 2024-01-28 DIAGNOSIS — M79672 Pain in left foot: Secondary | ICD-10-CM | POA: Diagnosis not present

## 2024-01-28 DIAGNOSIS — B351 Tinea unguium: Secondary | ICD-10-CM | POA: Diagnosis not present

## 2024-01-28 DIAGNOSIS — L84 Corns and callosities: Secondary | ICD-10-CM | POA: Diagnosis not present

## 2024-02-04 DIAGNOSIS — Z9089 Acquired absence of other organs: Secondary | ICD-10-CM | POA: Diagnosis not present

## 2024-02-04 DIAGNOSIS — Z9621 Cochlear implant status: Secondary | ICD-10-CM | POA: Diagnosis not present

## 2024-02-04 DIAGNOSIS — Q165 Congenital malformation of inner ear: Secondary | ICD-10-CM | POA: Diagnosis not present

## 2024-02-04 DIAGNOSIS — H903 Sensorineural hearing loss, bilateral: Secondary | ICD-10-CM | POA: Diagnosis not present

## 2024-02-04 DIAGNOSIS — L929 Granulomatous disorder of the skin and subcutaneous tissue, unspecified: Secondary | ICD-10-CM | POA: Diagnosis not present

## 2024-02-10 DIAGNOSIS — I10 Essential (primary) hypertension: Secondary | ICD-10-CM | POA: Diagnosis not present

## 2024-02-10 DIAGNOSIS — N400 Enlarged prostate without lower urinary tract symptoms: Secondary | ICD-10-CM | POA: Diagnosis not present

## 2024-02-10 DIAGNOSIS — Z299 Encounter for prophylactic measures, unspecified: Secondary | ICD-10-CM | POA: Diagnosis not present

## 2024-02-10 DIAGNOSIS — E1165 Type 2 diabetes mellitus with hyperglycemia: Secondary | ICD-10-CM | POA: Diagnosis not present

## 2024-03-03 DIAGNOSIS — Z9889 Other specified postprocedural states: Secondary | ICD-10-CM | POA: Diagnosis not present

## 2024-03-03 DIAGNOSIS — Z9621 Cochlear implant status: Secondary | ICD-10-CM | POA: Diagnosis not present

## 2024-03-03 DIAGNOSIS — Z45321 Encounter for adjustment and management of cochlear device: Secondary | ICD-10-CM | POA: Diagnosis not present

## 2024-03-03 DIAGNOSIS — H9193 Unspecified hearing loss, bilateral: Secondary | ICD-10-CM | POA: Diagnosis not present

## 2024-04-07 DIAGNOSIS — M79675 Pain in left toe(s): Secondary | ICD-10-CM | POA: Diagnosis not present

## 2024-04-07 DIAGNOSIS — M79674 Pain in right toe(s): Secondary | ICD-10-CM | POA: Diagnosis not present

## 2024-04-07 DIAGNOSIS — L84 Corns and callosities: Secondary | ICD-10-CM | POA: Diagnosis not present

## 2024-04-07 DIAGNOSIS — B351 Tinea unguium: Secondary | ICD-10-CM | POA: Diagnosis not present

## 2024-04-07 DIAGNOSIS — E1142 Type 2 diabetes mellitus with diabetic polyneuropathy: Secondary | ICD-10-CM | POA: Diagnosis not present

## 2024-04-21 DIAGNOSIS — H47012 Ischemic optic neuropathy, left eye: Secondary | ICD-10-CM | POA: Diagnosis not present

## 2024-04-21 DIAGNOSIS — Z961 Presence of intraocular lens: Secondary | ICD-10-CM | POA: Diagnosis not present

## 2024-04-21 DIAGNOSIS — E119 Type 2 diabetes mellitus without complications: Secondary | ICD-10-CM | POA: Diagnosis not present

## 2024-04-21 DIAGNOSIS — H15002 Unspecified scleritis, left eye: Secondary | ICD-10-CM | POA: Diagnosis not present

## 2024-04-21 DIAGNOSIS — C6932 Malignant neoplasm of left choroid: Secondary | ICD-10-CM | POA: Diagnosis not present

## 2024-04-22 DIAGNOSIS — H15002 Unspecified scleritis, left eye: Secondary | ICD-10-CM | POA: Diagnosis not present

## 2024-04-30 DIAGNOSIS — I10 Essential (primary) hypertension: Secondary | ICD-10-CM | POA: Diagnosis not present

## 2024-04-30 DIAGNOSIS — Z299 Encounter for prophylactic measures, unspecified: Secondary | ICD-10-CM | POA: Diagnosis not present

## 2024-04-30 DIAGNOSIS — Z7189 Other specified counseling: Secondary | ICD-10-CM | POA: Diagnosis not present

## 2024-04-30 DIAGNOSIS — E7800 Pure hypercholesterolemia, unspecified: Secondary | ICD-10-CM | POA: Diagnosis not present

## 2024-04-30 DIAGNOSIS — R5383 Other fatigue: Secondary | ICD-10-CM | POA: Diagnosis not present

## 2024-04-30 DIAGNOSIS — Z0000 Encounter for general adult medical examination without abnormal findings: Secondary | ICD-10-CM | POA: Diagnosis not present

## 2024-04-30 DIAGNOSIS — Z1331 Encounter for screening for depression: Secondary | ICD-10-CM | POA: Diagnosis not present

## 2024-04-30 DIAGNOSIS — Z125 Encounter for screening for malignant neoplasm of prostate: Secondary | ICD-10-CM | POA: Diagnosis not present

## 2024-04-30 DIAGNOSIS — Z1339 Encounter for screening examination for other mental health and behavioral disorders: Secondary | ICD-10-CM | POA: Diagnosis not present

## 2024-04-30 DIAGNOSIS — Z79899 Other long term (current) drug therapy: Secondary | ICD-10-CM | POA: Diagnosis not present

## 2024-05-05 DIAGNOSIS — H903 Sensorineural hearing loss, bilateral: Secondary | ICD-10-CM | POA: Diagnosis not present

## 2024-05-05 DIAGNOSIS — H6123 Impacted cerumen, bilateral: Secondary | ICD-10-CM | POA: Diagnosis not present

## 2024-05-05 DIAGNOSIS — Z9621 Cochlear implant status: Secondary | ICD-10-CM | POA: Diagnosis not present

## 2024-05-05 DIAGNOSIS — Q165 Congenital malformation of inner ear: Secondary | ICD-10-CM | POA: Diagnosis not present

## 2024-05-05 DIAGNOSIS — Z9089 Acquired absence of other organs: Secondary | ICD-10-CM | POA: Diagnosis not present

## 2024-05-05 DIAGNOSIS — H6120 Impacted cerumen, unspecified ear: Secondary | ICD-10-CM | POA: Diagnosis not present

## 2024-05-06 DIAGNOSIS — E7800 Pure hypercholesterolemia, unspecified: Secondary | ICD-10-CM | POA: Diagnosis not present

## 2024-05-06 DIAGNOSIS — Z299 Encounter for prophylactic measures, unspecified: Secondary | ICD-10-CM | POA: Diagnosis not present

## 2024-05-06 DIAGNOSIS — R52 Pain, unspecified: Secondary | ICD-10-CM | POA: Diagnosis not present

## 2024-05-06 DIAGNOSIS — K1379 Other lesions of oral mucosa: Secondary | ICD-10-CM | POA: Diagnosis not present

## 2024-05-21 DIAGNOSIS — Z299 Encounter for prophylactic measures, unspecified: Secondary | ICD-10-CM | POA: Diagnosis not present

## 2024-05-21 DIAGNOSIS — I10 Essential (primary) hypertension: Secondary | ICD-10-CM | POA: Diagnosis not present

## 2024-05-21 DIAGNOSIS — E119 Type 2 diabetes mellitus without complications: Secondary | ICD-10-CM | POA: Diagnosis not present

## 2024-05-21 DIAGNOSIS — Z23 Encounter for immunization: Secondary | ICD-10-CM | POA: Diagnosis not present

## 2024-05-21 DIAGNOSIS — K1379 Other lesions of oral mucosa: Secondary | ICD-10-CM | POA: Diagnosis not present

## 2024-06-16 DIAGNOSIS — M79674 Pain in right toe(s): Secondary | ICD-10-CM | POA: Diagnosis not present

## 2024-06-16 DIAGNOSIS — E1142 Type 2 diabetes mellitus with diabetic polyneuropathy: Secondary | ICD-10-CM | POA: Diagnosis not present

## 2024-06-16 DIAGNOSIS — L84 Corns and callosities: Secondary | ICD-10-CM | POA: Diagnosis not present

## 2024-06-16 DIAGNOSIS — M79675 Pain in left toe(s): Secondary | ICD-10-CM | POA: Diagnosis not present

## 2024-06-16 DIAGNOSIS — B351 Tinea unguium: Secondary | ICD-10-CM | POA: Diagnosis not present

## 2024-07-16 DIAGNOSIS — H47012 Ischemic optic neuropathy, left eye: Secondary | ICD-10-CM | POA: Diagnosis not present

## 2024-07-16 DIAGNOSIS — E119 Type 2 diabetes mellitus without complications: Secondary | ICD-10-CM | POA: Diagnosis not present

## 2024-07-16 DIAGNOSIS — C6932 Malignant neoplasm of left choroid: Secondary | ICD-10-CM | POA: Diagnosis not present

## 2024-07-16 DIAGNOSIS — Z961 Presence of intraocular lens: Secondary | ICD-10-CM | POA: Diagnosis not present

## 2024-07-17 DIAGNOSIS — Z9621 Cochlear implant status: Secondary | ICD-10-CM | POA: Diagnosis not present

## 2024-07-17 DIAGNOSIS — H903 Sensorineural hearing loss, bilateral: Secondary | ICD-10-CM | POA: Diagnosis not present

## 2024-07-17 DIAGNOSIS — Z9089 Acquired absence of other organs: Secondary | ICD-10-CM | POA: Diagnosis not present

## 2024-07-17 DIAGNOSIS — Q165 Congenital malformation of inner ear: Secondary | ICD-10-CM | POA: Diagnosis not present

## 2024-07-17 DIAGNOSIS — E119 Type 2 diabetes mellitus without complications: Secondary | ICD-10-CM | POA: Diagnosis not present

## 2024-07-17 DIAGNOSIS — H6120 Impacted cerumen, unspecified ear: Secondary | ICD-10-CM | POA: Diagnosis not present

## 2024-07-22 ENCOUNTER — Other Ambulatory Visit: Payer: Self-pay | Admitting: Urology

## 2024-07-22 DIAGNOSIS — N138 Other obstructive and reflux uropathy: Secondary | ICD-10-CM

## 2024-07-27 DIAGNOSIS — N179 Acute kidney failure, unspecified: Secondary | ICD-10-CM | POA: Diagnosis not present

## 2024-07-27 DIAGNOSIS — D72829 Elevated white blood cell count, unspecified: Secondary | ICD-10-CM | POA: Diagnosis not present

## 2024-07-27 DIAGNOSIS — I459 Conduction disorder, unspecified: Secondary | ICD-10-CM | POA: Diagnosis not present

## 2024-07-27 DIAGNOSIS — Z79899 Other long term (current) drug therapy: Secondary | ICD-10-CM | POA: Diagnosis not present

## 2024-07-27 DIAGNOSIS — Z7982 Long term (current) use of aspirin: Secondary | ICD-10-CM | POA: Diagnosis not present

## 2024-07-27 DIAGNOSIS — E876 Hypokalemia: Secondary | ICD-10-CM | POA: Diagnosis not present

## 2024-07-27 DIAGNOSIS — I959 Hypotension, unspecified: Secondary | ICD-10-CM | POA: Diagnosis not present

## 2024-07-27 DIAGNOSIS — R54 Age-related physical debility: Secondary | ICD-10-CM | POA: Diagnosis present

## 2024-07-27 DIAGNOSIS — E1122 Type 2 diabetes mellitus with diabetic chronic kidney disease: Secondary | ICD-10-CM | POA: Diagnosis present

## 2024-07-27 DIAGNOSIS — I4430 Unspecified atrioventricular block: Secondary | ICD-10-CM | POA: Diagnosis not present

## 2024-07-27 DIAGNOSIS — Z91041 Radiographic dye allergy status: Secondary | ICD-10-CM | POA: Diagnosis not present

## 2024-07-27 DIAGNOSIS — N1832 Chronic kidney disease, stage 3b: Secondary | ICD-10-CM | POA: Diagnosis present

## 2024-07-27 DIAGNOSIS — H9193 Unspecified hearing loss, bilateral: Secondary | ICD-10-CM | POA: Diagnosis present

## 2024-07-27 DIAGNOSIS — R471 Dysarthria and anarthria: Secondary | ICD-10-CM | POA: Diagnosis not present

## 2024-07-27 DIAGNOSIS — Z8673 Personal history of transient ischemic attack (TIA), and cerebral infarction without residual deficits: Secondary | ICD-10-CM | POA: Diagnosis not present

## 2024-07-27 DIAGNOSIS — Z87891 Personal history of nicotine dependence: Secondary | ICD-10-CM | POA: Diagnosis not present

## 2024-07-27 DIAGNOSIS — I129 Hypertensive chronic kidney disease with stage 1 through stage 4 chronic kidney disease, or unspecified chronic kidney disease: Secondary | ICD-10-CM | POA: Diagnosis present

## 2024-07-27 DIAGNOSIS — R110 Nausea: Secondary | ICD-10-CM | POA: Diagnosis not present

## 2024-07-27 DIAGNOSIS — R0600 Dyspnea, unspecified: Secondary | ICD-10-CM | POA: Diagnosis not present

## 2024-07-27 DIAGNOSIS — R451 Restlessness and agitation: Secondary | ICD-10-CM | POA: Diagnosis not present

## 2024-07-27 DIAGNOSIS — G9340 Encephalopathy, unspecified: Secondary | ICD-10-CM | POA: Diagnosis not present

## 2024-07-27 DIAGNOSIS — N3000 Acute cystitis without hematuria: Secondary | ICD-10-CM | POA: Diagnosis not present

## 2024-07-27 DIAGNOSIS — Z880 Allergy status to penicillin: Secondary | ICD-10-CM | POA: Diagnosis not present

## 2024-07-27 DIAGNOSIS — I440 Atrioventricular block, first degree: Secondary | ICD-10-CM | POA: Diagnosis not present

## 2024-07-27 DIAGNOSIS — F05 Delirium due to known physiological condition: Secondary | ICD-10-CM | POA: Diagnosis not present

## 2024-07-27 DIAGNOSIS — G9341 Metabolic encephalopathy: Secondary | ICD-10-CM | POA: Diagnosis not present

## 2024-07-27 DIAGNOSIS — R338 Other retention of urine: Secondary | ICD-10-CM | POA: Diagnosis not present

## 2024-07-27 DIAGNOSIS — N189 Chronic kidney disease, unspecified: Secondary | ICD-10-CM | POA: Diagnosis not present

## 2024-07-27 DIAGNOSIS — E870 Hyperosmolality and hypernatremia: Secondary | ICD-10-CM | POA: Diagnosis not present

## 2024-07-27 DIAGNOSIS — H5462 Unqualified visual loss, left eye, normal vision right eye: Secondary | ICD-10-CM | POA: Diagnosis present

## 2024-07-27 DIAGNOSIS — Z9621 Cochlear implant status: Secondary | ICD-10-CM | POA: Diagnosis present

## 2024-07-27 DIAGNOSIS — Z888 Allergy status to other drugs, medicaments and biological substances status: Secondary | ICD-10-CM | POA: Diagnosis not present

## 2024-07-27 DIAGNOSIS — R339 Retention of urine, unspecified: Secondary | ICD-10-CM | POA: Diagnosis not present

## 2024-07-27 DIAGNOSIS — Z66 Do not resuscitate: Secondary | ICD-10-CM | POA: Diagnosis not present

## 2024-07-27 DIAGNOSIS — Z7189 Other specified counseling: Secondary | ICD-10-CM | POA: Diagnosis not present

## 2024-07-27 DIAGNOSIS — R627 Adult failure to thrive: Secondary | ICD-10-CM | POA: Diagnosis not present

## 2024-07-27 DIAGNOSIS — I442 Atrioventricular block, complete: Secondary | ICD-10-CM | POA: Diagnosis present

## 2024-07-27 DIAGNOSIS — E639 Nutritional deficiency, unspecified: Secondary | ICD-10-CM | POA: Diagnosis not present

## 2024-07-27 DIAGNOSIS — E119 Type 2 diabetes mellitus without complications: Secondary | ICD-10-CM | POA: Diagnosis not present

## 2024-07-27 DIAGNOSIS — N401 Enlarged prostate with lower urinary tract symptoms: Secondary | ICD-10-CM | POA: Diagnosis present

## 2024-07-27 DIAGNOSIS — Z515 Encounter for palliative care: Secondary | ICD-10-CM | POA: Diagnosis not present

## 2024-07-27 DIAGNOSIS — R42 Dizziness and giddiness: Secondary | ICD-10-CM | POA: Diagnosis not present

## 2024-07-31 NOTE — Progress Notes (Signed)
 NICS Progress Note [facility] General Medicine Progress Note  Date of Admission:  07/27/2024 Length of Stay:  4 Days  ASSESSMENT / PLAN:  Blake George is a 89 y.o. 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 UA was equivocal for infection with low WBC/hpf but + LE.  Had retention + AKI as well as possible UTI contributing to mental status changes, but so far not improving with abx and Foley placement. Doubt non-convulsive status. Meds reviewed; he did receive Ativan earlier in hospital stay which may have precipitated delirium.   Prior CT head earlier this year showed mild cerebral volume loss, diminished white matter attenuation consistent with chronic small vessel ischemic changes.  Could have underlying vascular dementia componennt. Worry most for hospital delirium in patient with suspected low cognitive reserve. TSH and ammonia were WNL. 12/5: mentation actually worse after transfer to 3W floor.   PLAN: - Currently out of restraints, not pulling at lines - melatonin 3 mg PO qhs - 12/5: add Seroquel 25 mg PO qhs tonight and monitor Qtc - 12/5: add PRN Zyprexa 5 mg IM for agitation overnight - 12/5: will add CT head wo contrast, though exam remains non-focal - treat UTI, AKI, hypernatremia - hold off on antipsychotics at this time - cont' Foley - - environmental precautions:   > avoid sensory deprivation   > lights on, engage in conversation, frequently re-orient and stimulate during daytime   > avoid sedating medications   > avoid nocturnal interruptions   > keep TV off in room at night   > keep well-hydrated as able  - f/u Vit B12  #Acute urinary retention status post Foley catheter placed on  07/29/2024, history of BPH, urge incontinence  - Foley placed 07/29/24 for retention - cont ' Proscar, Flomax  from home - leave Foley for 4-6 weeks - OP referral for f/u with Urology at discharge  #AKI on CKD stage IIIb, suspect due to urinary retention, RESOLVING Cr of 2.14 on 07/29/24 due to urinary retention. Baseline Cr ~1.4-1.95 Cr 07/31/2024 is 1.4. Etiology likely due to urinary retention and possibly prerenal azotemia as well. PLAN: - cont ' IVF - maintain Foley - avoid nephrotoxins - no NSAIDs - renally dose medications as indicated - trend Cr / electrolytes - strict I/O monitoring    #Hypernatremia Noted on 07/31/24. I suspect this may be more related to NS infusion than dehydration given the improvement in his renal indices.  Still, given his confusion 12/5, likely needs maintenance IVF to avoid dehydration. PLAN: - 12/5: switch NS to LR infusion at 75 cc/hr x 24hrs - trend Na  High-grade AV block status post pacemaker placement on 07/28/2024 - EKG on 07/27/2024 shows probable sinus tachycardia with complete heart block with junctional escape rhythm PLAN: - Cardiology is primary   History of stroke/TIA - No acute issues   Bilateral cochlear implants in 2013  - Chronic hearing difficulties noted and complicates his care.   Diabetes mellitus type 2 non-insulin -dependent dependent - hold home glipizide - start glycemic surveillance   Fluids, electrolytes, nutrition Fluids: KVO, monitor lytes and replete prn, Diet Mechanical Soft/Easy to Chew 7 Carb Consistent, Cardiac Percent Meals Eaten (%): 0 %  Prophylaxis DVT: SubQ Heparin starting 07/31/24   GI:  Not indicated Foley catheter:yes, remains medically necessary Central line:no Telemetry:yes, remains medically necessary     General  Ethics: Full Code    Surrogate decision maker is his wife  PCP: No primary care provider on file. None  Specialists: IP CONSULT TO CASE MANAGEMENT, RN/SW IP CONSULT TO  HOSPITALIST IP CONSULT TO CASE MANAGEMENT, RN/SW IP CONSULT TO IV TEAM NH IP TELESITTER NOTIFICATION          Disposition  when confusion clears, likely d/c to SNF vs IPR    Discussed plan of care with RN, Case Manager, Therapist, and Consulting Physician  07/31/24: I provided a FACE-TO-FACE, BEDSIDE update to the patient's family member(s) today.  We discussed diagnoses, plan of care, and likely disposition.  I gave ample opportunity for them to ask questions and did my best to answer all of them.   I have discussed the diagnoses and care plan with the patient.   Total visit time = 52 minutes; more than 50% of which was spent on counseling/coordinating care.    Chief complaint: AMS  Subjective/Events Overnight:   Pt was agitated overnight.  Received IM Zyprexa 2.5 mg which led to mild improvement.  Today he remains agitated but is less lucid, more confused.  Most of his speech is unintelligible and garbled.  He does not follow commands and is completely disoriented.  Na today worse at 148.  Cr did improve to 1.4  ROS: Pertinent positives and negatives entailed in the interval history above.  Unless otherwise stated, all other systems were reviewed and determined to be negative.   Objective   Vital signs in last 24 hours: Intake/Output:  Temp:  [99.2 F (37.3 C)] 99.2 F (37.3 C) Heart Rate:  [69-94] 69 Resp:  [16-20] 18 BP: (96-145)/(69-89) 104/80 SpO2:  [94 %] 94 % O2 Flow Rate (L/min): 2 L/min  Weight change: 0.062 kg (2.2 oz) Wt Readings from Last 1 Encounters:  07/31/24 73 kg (160 lb 15 oz)     Intake/Output Summary (Last 24 hours) at 07/31/2024 1756 Last data filed at 07/31/2024 1234 Gross per 24 hour  Intake 0 ml  Output 600 ml  Net -600 ml        Physical Exam   Constitutional - resting comfortably, no acute distress Eyes - pupils equal round and reactive to light  Nose - no gross deformity or drainage Mouth - no oral lesions noted Throat - no  swelling or erythema    CV - (+) S1S2, no murmurs; no peripheral edema, no JVD  Resp - normal WOB; no  wheezes; no rhonchi; no crackles; no clubbing, cyanosis  GI - (+) BS, soft, non-tender, non-distended MSK - ROM normal   Skin - no rashes; no wounds Neuro - alert, aware, oriented to person but not place, time or situation, inattentive; speech is garbled and unitelligible.   Psych - normal affect and mood   TUBES/LINES/DRAINS: Foley catheter in place, draining clear, straw-colored urine.    Labs, imaging, special studies and medications were all independently reviewed by me, with significant findings today interpreted below and/or in assessment/plan section:  Recent Results (from the past 72 hours)  CBC   Collection Time: 07/31/24  8:33 AM  Result Value Ref Range   WBC 9.0 4.0 - 10.5 thou/mcL   RBC 3.22 (L) 4.63 - 6.08 million/mcL   HGB 10.8 (L) 13.7 - 17.5 gm/dL   HCT 66.3 (L) 59.8 - 48.9 %   MCV 104.3 (H) 79.0 - 92.2 fL  MCH 33.5 (H) 25.7 - 32.2 pg   MCHC 32.1 (L) 32.3 - 36.5 gm/dL   Plt Ct 829 849 - 599 thou/mcL   RDW SD 48.6 (H) 35.1 - 46.3 fL   MPV 10.4 9.4 - 12.4 fL   NRBC% 0.0 0.0 - 0.2 /100WBC   Absolute NRBC Count 0.00 0.00 - 0.01 thou/mcL    No results found for this or any previous visit (from the past 72 hours).  Recent Results (from the past 72 hours)  Basic Metabolic Panel   Collection Time: 07/31/24  8:33 AM  Result Value Ref Range   Na 148 (H) 136 - 146 mmol/L   Potassium 4.1 3.7 - 5.4 mmol/L   Cl 115 (H) 97 - 108 mmol/L   CO2 19 (L) 20 - 32 mmol/L   AGAP 14 7 - 16 mmol/L   Glucose 122 (H) 65 - 99 mg/dL   BUN 47 (H) 10 - 36 mg/dL   Creatinine 8.59 (H) 9.23 - 1.27 mg/dL   Ca 8.8 8.6 - 89.7 mg/dL   BUN/CREAT RATIO 66.3 (H) 11.0 - 26.0   eGFR 47 (L) >=60 mL/min/1.49m2   No results found for this or any previous visit (from the past 72 hours).     cefTRIAXone   1 g IntraVENous Q24H   finasteride  5 mg Oral Daily   [Held by Provider] glipiZIDE   2.5 mg Oral Daily   heparin  5,000 Units Subcutaneous Q8H 02-07-21   melatonin  3 mg Oral HS   QUEtiapine fumarate  25 mg Oral HS   tamsulosin   0.4 mg Oral HS    LR 75 mL/hr (07/31/24 1749)   acetaminophen  **OR** acetaminophen , acetaminophen  **OR** acetaminophen , albuterol sulfate, benzonatate, calcium carbonate, diphenhydrAMINE, nitroGLYCERIN, OLANZapine, oxyCODONE HCl, oxyCODONE HCl, polyethylene glycol    Alm ONEIDA Meadows, MD 07/31/2024 5:56 PM Pager#: [phone]

## 2024-08-05 NOTE — Discharge Summary (Signed)
 "  THE [facility]  Discharge Summary  PCP: No primary care provider on file. Discharge Details   Admit date:         07/27/2024 Discharge date and time:       08/06/2024 Hospital LOS:    11  days  Active Hospital Problems   Diagnosis Date Noted POA   *CHB (complete heart block) (*) 07/27/2024 Yes   Leukocytosis 08/03/2024 No   FTT (failure to thrive) in adult 08/02/2024 Clinically Undetermined   Acute metabolic encephalopathy 08/01/2024 Unknown   Acute kidney injury superimposed on CKD 08/01/2024 Unknown   Hypernatremia 07/31/2024 Unknown   Heart block 07/27/2024 Unknown    Resolved Hospital Problems   Diagnosis Date Noted Date Resolved POA   Acute urinary retention 08/01/2024 08/05/2024 Yes      Current Discharge Medication List     START taking these medications      Details  insulin  lispro (HUMALOG,ADMELOG) 100 Units/mL injection  Inject one Units to five Units into the skin mealtime. Quantity: 3 mL       CONTINUE these medications which have NOT CHANGED      Details  aspirin -dipyridamole  25-200 mg per 12 hr capsule Commonly known as: AGGRENOX   Take one capsule by mouth 2 (two) times daily.   dutasteride  0.5 MG capsule Commonly known as: AVODART   Take one capsule (0.5 mg dose) by mouth daily.   glipiZIDE 2.5 MG Tabs  Take 2.5 mg by mouth daily.   multivitamin tablet  Take one tablet by mouth daily.   rosuvastatin calcium 5 mg tablet Commonly known as: CRESTOR  Take one tablet (5 mg dose) by mouth once a week. FRIDAYS   tamsulosin  0.4 mg Caps Commonly known as: FLOMAX   Take one capsule (0.4 mg dose) by mouth at bedtime.   vitamin B-12 1000 mcg tablet Commonly known as: CYANOCOBALAMIN  Take one tablet (1,000 mcg dose) by mouth at bedtime.      * You might also be taking other medications not listed above. If you have questions about any of your other medications, talk to the person  who prescribed them or your Primary Care Provider.              Hospital Course   Indication for Admission/chief complaint: High grade AV block/Nausea, dizziness, abdominal pain  HPI (Per admitting provider) Patient is a 89 y.o. male with no reported coronary history and PMH including CVA, HLD, T2DM, BPH, HTN admitted for high grade AV block.  Presented [facility] via EMS with dizziness, lightheadedness, nausea and abdominal pain with low HRs.  EKG thought to be CHB and patient accepted to [facility].  One EKG sent from OSH shows Wide QRS rhythm RBBB possibly 2:1 AVB or CHB but with significant artifact.  No notation I can find whether patient HDS throughout.  Per RN report when [facility] arrived for transport attempts were made to transcutaenously pace patient but there was no capture so this was discontinued.  He arrives to [facility] HRs in 40s on epinephrine  and dopamine  drips.  BPs acceptable.  He reports no symptoms.  EKG at [facility] looks like probable CHB, Wide QRS HR 41bpm.   OSH labs:  Na 141, K 4.6, Mg 2.0, Hgb 11.2, AGAP 12, SCr 1.59, CXR negative for acute process  Hospital Course:       On 12/2 Mr. Blake George underwent a dual chamber pacemaker with cardiology. Post-op had some confusion, but stable from cardiac standpoint.  Hospitalist team was consulted and took over care on 12/3. Urine was concerning for possible cystitis with mild leukocytosis and he also developed urinary retention and required a foley. He had an acute AKI on chronic CKD with Cr 2.14 that improved with IV fluids.Despite IV antibiotics w/ Rocephin  (completed 12/7), he continued to have some metabolic encephalopathy with agitation, and appeared to have some failure to thrive by refusing food and taking medications. It became unclear if these symptoms would improve and palliative care was consulted for goals of care. He is still somewhat confused, but more alert and agitation has improved. He has also been eating some for the family on a  dysphagia diet. Will need outpatient follow-up with cardiology to check pacemaker.   Acute urinary retention status post Foley catheter placed on 07/29/2024, history of BPH, urge incontinence . Improving.  - Continue Foley placed 07/29/24 for retention - cont ' Proscar, Flomax  from home- will request voiding trial at [facility] and outpatient Urology followup   AKI on CKD stage IIIb, suspect due to urinary retention, RESOLVING Cr of 2.14 on admission. Stable 1.10. Good urine output.  Baseline Cr ~1.4-1.95   High-grade AV block status post pacemaker placement on 07/28/2024 - EKG on 07/27/2024 shows probable sinus tachycardia with complete heart block with junctional escape rhythm. 12/3 repeat EKG SR with 1 degree block. Appears cardiology may have signed off since stable. PLAN: -Outpatient cardiology referral placed in pending D/C orders    Bilateral cochlear implants in 2013  - Chronic hearing difficulties noted and complicates his care.   Diabetes mellitus type 2 non-insulin -dependent dependent Glucose levels 132-222 since 12/9 1800 He will resume outpatient regimen and SSI   Physical Exam: BP 149/71 (BP Location: Right Upper Arm, Patient Position: Lying)   Pulse 78   Temp 98.9 F (37.2 C) (Axillary)   Resp 19   Ht 1.803 m (5' 11)   Wt 72 kg (158 lb 11.2 oz)   SpO2 96%   BMI 22.13 kg/m   Wt Readings from Last 1 Encounters:  08/06/24 72 kg (158 lb 11.2 oz)     Physical Exam  Labs on Discharge:  Recent Labs    Units 08/06/24 0122 08/04/24 1104 08/03/24 1041 08/01/24 0838 07/31/24 0833  WBC thou/mcL 8.9 11.1* 12.3* 7.4 9.0  HGB gm/dL 87.9* 88.4* 88.1* 88.8* 10.8*  HCT % 36.3* 35.9* 35.1* 34.1* 33.6*  PLT thou/mcL 201 182 180 183 170   Recent Labs    Units 08/06/24 0122 08/04/24 1104 08/03/24 1041 08/02/24 0808 08/01/24 0838 07/31/24 0833  NA mmol/L 145 145 145 149* 149* 148*  K mmol/L 3.3* 3.6* 3.8 4.0 3.8 4.1  CL mmol/L 109* 109* 110* 114* 114* 115*  CO2  mmol/L 23 23 24 24 21  19*  BUN mg/dL 25 29 27  33 39* 47*  CREATININE mg/dL 8.95 8.85 8.89 8.89 8.69* 1.40*  CALCIUM mg/dL 8.6 8.5* 8.7 8.7 8.9 8.8  No results for input(s): BILITOT, AST, ALT, ALKPHOS, PROT, ALBUMIN, LDH, URICACID in the last 168 hours. Recent Labs    Units 08/03/24 1423  HGBA1C % 7.5*   No results for input(s): LABPROT, INR, PTT in the last 168 hours. No results for input(s): CHOL, LDL, HDL, TRIG in the last 168 hours. No results for input(s): TROPONIN, CK in the last 168 hours.  Invalid input(s): CK-MB  Diagnostics:  CT Head WO Contrast  Final Result  IMPRESSION:   Degraded exam    1.  No acute intracranial abnormality.  2.  Chronic small vessel ischemic disease is likely.  3.  Mild cerebral volume loss.  4.  Bilateral bilateral mastoid opacification status post mastoidectomies. A right cochlear implant is present.    Electronically Signed by: Maude Angst on 08/01/2024 11:15 AM    CT Abdomen Pelvis WO IV Contrast (Stone Protocol)  Final Result  IMPRESSION:   1.  Mild fullness of the renal pelvis and ureters bilaterally. Prostatomegaly with Foley catheter in the bladder that contains a moderate amount of residual fluid. Assess for function catheter.    2.  Small water  density and indeterminate density renal lesions suggest follow-up ultrasound.    3.  Indeterminate hepatic lesions not simple cysts. Follow-up abdominal ultrasound for clarification.    4.  Layering density in the gallbladder calculi/sludge versus vicarious contrast excretion. This can also be assessed on ultrasound.    5.  Small bilateral pleural effusions with mild bibasilar atelectasis.    6.  Abdominal aortic aneurysm measuring 3.8 cm.    7.  Moderate right-sided colonic fecal retention.    8.  Question prior splenectomy with splenosis in the left upper quadrant. Please correlate with surgical history.           Electronically Signed by: Grayce Linear, MD on 07/30/2024 8:11 AM    XR Chest Pa And Lateral  Final Result  IMPRESSION:   XR CHEST   Mild bilateral basilar densities are present, likely secondary to subsegmental atelectasis and less likely developing infiltrates.          Electronically Signed by: Charlie Patch, MD on 07/29/2024 7:05 AM    EP study  Final Result  wall of the right atrium. Testing here acceptable   parameters with acute current of injury noted and 10 V pacing output   showed no diaphragmatic stimulation. The lead was secured to prepectoral   fascia with 2-0 Ethibond sutures.    The wound was irrigated with antibiotic solution. Hemostasis was   established with cautery. Arista was applied to wound bed to promote   hemostasis. Both leads were connected to pacemaker generator. Both leads   and pacemaker generator were inserted into the pocket made earlier. An   anchoring suture was placed over the pacemaker generator. The wound was   then closed with 2-0 Polysorb interrupted sutures and 4-0 Monocryl running   subcuticular sutures. Dermabond was applied over incision.    Device information  Atrial lead:  [employer] model 5076 serial [id number]  P wave: 1.4 mV  Impedance: 526 ohms  Threshold: 1.0 @ 0.5 ms    Ventricular lead:  [employer] model 3830 serial [id number]  R wave: 7.2 mV  Impedance: 846 ohms  Threshold: 1.0 @ 0.5 ms    PPM generator: [employer] model [id number] serial [id number]    SUMMARY:   Successful dual-chamber pacemaker implantation.      Echocardiogram Complete W Enhancing Agent  Final Result  Left Ventricle: Systolic function is hyperdynamic. EF: >70%.          Procedures (LRB): EP Pacemaker Insertion (Dual Chamber) (Left)  07/28/2024  Surgeon(s): Awanda Meredeth Fetch, MD ------------------- [facility]   Discharge Procedure Orders  Ambulatory referral to Cardiology  Standing Status: Future  Referral Priority: Urgent Referral Type: Consultation  Referral  Reason: Evaluate and Return Referral Location: [facility]  Requested Specialty: Cardiology  Number of Visits Requested: 1 Expiration Date: 01/25/25   Ambulatory referral to Urology  Standing Status: Future  Referral Priority: Routine Referral Type: Consultation  Referral Reason: Evaluate and Return  Requested Specialty: Urology  Number of Visits Requested: 1 Expiration Date: 01/31/25   Consistent Carbohydrate Diet (diabetic)   Notify physician - Temp  Order Comments: Call MD if Temperature above 100  Degrees F   Notify Physician for signs of infection (swelling, heat, redness, red streaks, pus formation, odor from incision or any other trouble at the surgical site)   Notify physician - Contact your doctor for excessive bleeding   Activity as tolerated  Order Comments: Please remember you arm restrictions-do not raise the arm on the side of your new device above your head for 6 weeks and please do not lift greater than 5lbs with that arm for 6 weeks. Otherwise progress your activity as tolerated.   Shower/Bath  Order Comments: May shower tomorrow.   Discharge instructions  Order Comments: [employer] will call you within 5 days to discuss home monitoring of your new cardiac device. If you do not receive a call within 5 business days, please call [phone].  You will need to follow up at [facility] in 1 week for wound check, 6 weeks for device check and in 3 months with Dr. Merilee. Our office will call to arrange these appointments.  Our number is [phone] if you have questions/concerns.   Notify physician (specify)  Order Comments: If you develop fevers, chills, chest pain, shortness of breath   Activity as tolerated    Diet: Diet Puree 4 Thin 0; Carb Consistent, Cardiac; Meal Supervision: Aspiration Dietary nutrition supplements Glucerna; Chocolate Consistent Carbohydrate Diet (diabetic)  Potential for Rehab:        Good Code Status:   No  CPR Disposition: Skilled nursing facility Consults: Palliative  Followup appointments: No future appointments.   Time spent in discharge process:  total time spent 45 minutes   Electronically signed: Richardo KATHEE Essex, MD 08/06/2024 / 1:22 PM   *Some images could not be shown."

## 2024-08-05 NOTE — Progress Notes (Signed)
 "      [facility] Progress Note [facility] Progress Note  Date of Admission:  07/27/2024 Length of Stay:  9 Days  Assessment/Plan:  Blake George is a 89 y.o. White or Caucasian [1] male with:  Assessment: Principal Problem:   CHB (complete heart block) (*) Active Problems:   Heart block   Hypernatremia   Acute urinary retention   Acute metabolic encephalopathy   Acute kidney injury superimposed on CKD   FTT (failure to thrive) in adult   Leukocytosis  HPI  (per Admitting provider) Patient is a 89 y.o. male with no reported coronary history and PMH including CVA, HLD, T2DM, BPH, HTN admitted for high grade AV block.  Presented [facility] via EMS with dizziness, lightheadedness, nausea and abdominal pain with low HRs.  EKG thought to be CHB and patient accepted to [facility].  One EKG sent from OSH shows Wide QRS rhythm RBBB possibly 2:1 AVB or CHB but with significant artifact.  No notation I can find whether patient HDS throughout.  Per RN report when [facility] arrived for transport attempts were made to transcutaenously pace patient but there was no capture so this was discontinued.  He arrives to [facility] HRs in 40s on epinephrine  and dopamine  drips.  BPs acceptable.  He reports no symptoms.  EKG at [facility] looks like probable CHB, Wide QRS HR 41bpm.   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 UA was equivocal for infection with low WBC/hpf but + LE.  Had retention + AKI as well as possible UTI contributing to mental status changes, but so far not improving with abx and Foley placement. Meds reviewed; he did receive Ativan earlier in hospital stay which may have precipitated delirium. Also received Zyprexa x 2.  Prior CT head earlier this year showed mild cerebral volume loss, diminished white matter attenuation consistent with  chronic small vessel ischemic changes. B12 >2000. TSH 3.49 (Euthyroid), ammonia 20, RPR non-reactive. CT head 12/5: neg acute findings, chronic small vessel disease present. Less likely related to suspected UTI as no improvement with confusion with antibiotics. This waxes and wanes, but overall improving and less agitation.  PLAN: - Continue melatonin 3 mg PO qhs -  Rocephin  completed 12/7 for possible UTI - cont' Foley - - environmental precautions:   > avoid sensory deprivation   > lights on, engage in conversation, frequently re-orient and stimulate during daytime   > avoid sedating medications   > avoid nocturnal interruptions   > keep TV off in room at night   > keep well-hydrated as able   Leukocytosis WBC trending back down 11.1 today from 12.3 on 12/8. Currently afebrile and hemodynamically stable. Will monitor closely.  -Repeat CBC in am   Hypernatremia Mild hypokalemia Na improving 145 last few days. K 3.6 today.  -Repleted with KDUR 40meq x 1 -Repeat BMP in am   Failure To Thrive Patient has been confused during hospitalization, intermittent agitation and previously refusing to eat or take medications at times.  This is most likely related to his metabolic encephalopathy. Palliative care following for goals of care. Seems to be eating better and taking medications. -Continue to monitor  #Acute urinary retention status post Foley catheter placed on 07/29/2024, history of BPH, urge incontinence . Improving.  - Continue Foley placed 07/29/24 for retention - cont ' Proscar, Flomax  from home - leave Foley for  4-6 weeks - OP referral for f/u with Urology at discharge placed under pending D/C orders   #AKI on CKD stage IIIb, suspect due to urinary retention, RESOLVING Cr of 2.14 on admission. Stable 1.10. Good urine output.  Baseline Cr ~1.4-1.95  PLAN: - Encourage oral hydration - maintain Foley - avoid nephrotoxins - no NSAIDs - renally dose medications as indicated -  trend Cr / electrolytes - strict I/O monitoring     High-grade AV block status post pacemaker placement on 07/28/2024 - EKG on 07/27/2024 shows probable sinus tachycardia with complete heart block with junctional escape rhythm. 12/3 repeat EKG SR with 1 degree block. Appears cardiology may have signed off since stable. PLAN: -Outpatient cardiology referral placed in pending D/C orders   History of stroke/TIA - No acute issues   Bilateral cochlear implants in 2013  - Chronic hearing difficulties noted and complicates his care.   Diabetes mellitus type 2 non-insulin -dependent dependent Glucose levels 132-222 since 12/9 1800 - Continue Lantus to 9 units HS/ Continue SSI --hold home glipizide - Continue glycemic surveillance Fluids, electrolytes, nutrition Fluids: KVO, monitor lytes and replete prn, Diet: Diet Puree 4 Thin 0; Carb Consistent, Cardiac; Meal Supervision: Aspiration Dietary nutrition supplements Glucerna; Chocolate Percent Meals Eaten (%): 75 %  Prophylaxis DVT Pharmacological Prophylaxis (From admission, onward)     Start     Ordered   07/31/24 0600  heparin injection 5,000 Units  Every 8 hours @ 0600 & 1400 & 2200        07/30/24 1532           DVT Mechanical Prophylaxis (From admission, onward)     Start     Ordered   07/27/24 2037  Place sequential compression device  Until discontinued       Comments: Place sequential compression device.  Start Time: 07/27/24 2037  Order ID: [id number]  Status: Sent  Question:  Location  Answer:  Bilateral Lower Extremities   07/27/24 2037   07/27/24 2037  No Pharmaceutical VTE Prophylaxis  Once (Routine)       Comments: Need for cardiac procedure  Start Time: 07/27/24 2037  Order ID: [id number]  Status: Completed  Question:  Reason that no pharmacological VTE prophylaxis was started upon admission:  Answer:  Other: Please document reason in comments   07/27/24 2037              GI:  Not indicated Foley  catheter:yes, remains medically necessary Central line:no Telemetry:No  General  Code status: Full code  Surrogate decision maker Wife: Blake George  PCP: No primary care provider on file. None Specialists: Palliative         Disposition D/C pending SNF acceptance  Discussed plan of care with  nurse I have discussed the diagnoses and care plan with the patient and spouse.       Chief complaint: AMS  Subjective/Events Overnight:  Blake George feels well this morning. Sitting up in chair and eating breakfast with wife at bedside. Family is touring some of accepted SNF facilities today, case management is assisting with this for placement. He denies any chest pain, shortness of breath, abdominal pain, N/V/D. Foley catheter intact and draining clear/yellow urine. Case management following for SNF placement.    Objective   Vital signs in last 24 hours: Intake/Output:  Temp:  [98.2 F (36.8 C)-99.6 F (37.6 C)] 98.3 F (36.8 C) Heart Rate:  [60-91] 91 Resp:  [18-19] 19 BP: (139-163)/(61-85) 163/85 SpO2:  [94 %-97 %] 94 %  O2 Flow Rate (L/min): 2 L/min  Wt Readings from Last 1 Encounters:  08/05/24 74.2 kg (163 lb 9.3 oz)    Weight change: -0.054 kg (-1.9 oz)  Intake/Output Summary (Last 24 hours) at 08/05/2024 1321 Last data filed at 08/05/2024 1000 Gross per 24 hour  Intake 240 ml  Output 1850 ml  Net -1610 ml         Physical Exam Constitutional:      General: He is not in acute distress.    Appearance: He is normal weight. He is not ill-appearing or toxic-appearing.  Cardiovascular:     Rate and Rhythm: Normal rate and regular rhythm.     Heart sounds: No murmur heard.    No friction rub.  Pulmonary:     Effort: No respiratory distress.     Breath sounds: No stridor. No wheezing or rhonchi.  Abdominal:     General: Bowel sounds are normal. There is no distension.     Palpations: Abdomen is soft. There is no mass.     Tenderness: There is no abdominal tenderness.   Musculoskeletal:        General: Normal range of motion.     Right lower leg: No edema.     Left lower leg: No edema.  Skin:    General: Skin is warm and dry.     Capillary Refill: Capillary refill takes less than 2 seconds.     Coloration: Skin is not jaundiced or pale.     Findings: No erythema.         Comments: Closed surgical incision left upper chest without erythema or drainage.  Neurological:     Mental Status: He is alert. He is disoriented.  Psychiatric:        Behavior: Behavior normal.     Recent Labs    Units 08/04/24 1104  WBC thou/mcL 11.1*  HGB gm/dL 88.4*  HCT % 64.0*  PLT thou/mcL 182   Recent Labs    Units 08/04/24 1104  NA mmol/L 145  K mmol/L 3.6*  CL mmol/L 109*  CO2 mmol/L 23  BUN mg/dL 29  CREATININE mg/dL 8.85  CALCIUM mg/dL 8.5*   No results for input(s): MAGNESIUM in the last 168 hours.  Recent Labs    Units 08/03/24 1423 07/30/24 1007  TSH mcIU/mL  --  3.49  HGBA1C % 7.5*  --    No results for input(s): BILITOT, AST, ALT, ALKPHOS, PROT, ALBUMIN in the last 168 hours.  No results for input(s): LABPROT, INR, PTT in the last 168 hours. No results for input(s): CHOL, LDL, HDL, TRIG in the last 168 hours. Recent Labs    Units 08/05/24 0914 08/04/24 2208 08/04/24 1805 08/04/24 1247 08/04/24 1104 08/04/24 0845  GLUCOSE mg/dL 867* 783* 777* 753* 755* 190*   No results for input(s): TROPONIN, CK in the last 168 hours.  Invalid input(s): CK-MB No results for input(s): BNP in the last 168 hours.    Pertinent Radiological findings (summarize):  No results found.    Cardiac tracings:  Telemetry:  Viewed by me:   A. Paced, Regular Rate   finasteride  5 mg Oral Daily   [Held by Provider] glipiZIDE  2.5 mg Oral Daily   heparin  5,000 Units Subcutaneous Q8H 02-07-21   insulin  glargine  9 Units Subcutaneous HS   insulin  lispro (HUMALOG,ADMELOG)  1-5 Units Subcutaneous Meals   melatonin   3 mg Oral HS   NaCl  10 mL Intracatheter Q12H [facility]  QUEtiapine fumarate  25 mg Oral HS   tamsulosin   0.4 mg Oral HS    NaCl     acetaminophen  **OR** acetaminophen , acetaminophen  **OR** acetaminophen , albuterol sulfate, benzonatate, calcium carbonate, cloNIDine, dextrose  **OR** dextrose  **OR** dextrose  **OR** glucagon, diphenhydrAMINE, hydrALAZINE HCl, NaCl, NaCl **AND** NaCl, nitroGLYCERIN, OLANZapine, oxyCODONE, oxyCODONE, polyethylene glycol        Tinnie JINNY Prost, NP 08/05/2024 1:21 PM      "

## 2024-08-06 NOTE — Care Plan (Signed)
" °  Problem: Deep Venous Thrombosis, Risk of Goal: Absence of deep venous thrombosis 08/06/2024 0317 by Arlean Blumenthal, RN Outcome: Progressing 08/06/2024 0150 by Arlean Blumenthal, RN Outcome: Progressing   Problem: Discharge Planning Goal: Knowledge of medical problems (What is my main problem?) Outcome: Progressing Goal: Knowledge of self care (What do I need to do when I go home?) Outcome: Progressing Goal: Knowledge of treatment plan (Why is it important for me to do this?) Outcome: Progressing Goal: Knowledge of medication management Outcome: Progressing   Problem: Injury Risk, Abnormal Glucose Level Goal: Glucose level within specified parameters 08/06/2024 0317 by Arlean Blumenthal, RN Outcome: Progressing 08/06/2024 0150 by Arlean Blumenthal, RN Outcome: Progressing   Problem: Sensory Perception - Impaired Goal: Absence of physical injury Outcome: Progressing   Problem: Fall Prevention Goal: Absence of falls Outcome: Progressing   Problem: Pressure Injury Goal: Pressure injury healing Outcome: Progressing Goal: Absence of new pressure injury Outcome: Progressing   Problem: Restraint Use, Nonviolent/Non-Self-Destructive Behavior Goal: Absence of injury Outcome: Progressing Goal: Absence of physical restraint indications Outcome: Progressing   Problem: Bleeding, Risk of Goal: Absence of active bleeding Outcome: Progressing Goal: Absence of impaired coagulation signs and symptoms Outcome: Progressing   Problem: Nutrition Goal: Nutritional intake, appropriate for patient status Outcome: Progressing   "

## 2024-08-07 DIAGNOSIS — N401 Enlarged prostate with lower urinary tract symptoms: Secondary | ICD-10-CM | POA: Diagnosis not present

## 2024-08-07 DIAGNOSIS — Z950 Presence of cardiac pacemaker: Secondary | ICD-10-CM | POA: Diagnosis not present

## 2024-08-07 DIAGNOSIS — R338 Other retention of urine: Secondary | ICD-10-CM | POA: Diagnosis not present

## 2024-08-07 DIAGNOSIS — E1122 Type 2 diabetes mellitus with diabetic chronic kidney disease: Secondary | ICD-10-CM | POA: Diagnosis not present

## 2024-08-07 DIAGNOSIS — Z9621 Cochlear implant status: Secondary | ICD-10-CM | POA: Diagnosis not present

## 2024-09-30 ENCOUNTER — Encounter (HOSPITAL_COMMUNITY): Payer: Self-pay

## 2024-09-30 ENCOUNTER — Emergency Department (HOSPITAL_COMMUNITY)

## 2024-09-30 ENCOUNTER — Inpatient Hospital Stay (HOSPITAL_COMMUNITY): Admission: EM | Admit: 2024-09-30 | Source: Skilled Nursing Facility

## 2024-09-30 ENCOUNTER — Other Ambulatory Visit: Payer: Self-pay

## 2024-09-30 DIAGNOSIS — G9340 Encephalopathy, unspecified: Secondary | ICD-10-CM | POA: Diagnosis not present

## 2024-09-30 DIAGNOSIS — N3001 Acute cystitis with hematuria: Principal | ICD-10-CM

## 2024-09-30 DIAGNOSIS — E860 Dehydration: Secondary | ICD-10-CM

## 2024-09-30 DIAGNOSIS — G9341 Metabolic encephalopathy: Secondary | ICD-10-CM

## 2024-09-30 LAB — COMPREHENSIVE METABOLIC PANEL WITH GFR
ALT: 34 U/L (ref 0–44)
AST: 43 U/L — ABNORMAL HIGH (ref 15–41)
Albumin: 3.2 g/dL — ABNORMAL LOW (ref 3.5–5.0)
Alkaline Phosphatase: 243 U/L — ABNORMAL HIGH (ref 38–126)
Anion gap: 17 — ABNORMAL HIGH (ref 5–15)
BUN: 33 mg/dL — ABNORMAL HIGH (ref 8–23)
CO2: 21 mmol/L — ABNORMAL LOW (ref 22–32)
Calcium: 9 mg/dL (ref 8.9–10.3)
Chloride: 108 mmol/L (ref 98–111)
Creatinine, Ser: 1.45 mg/dL — ABNORMAL HIGH (ref 0.61–1.24)
GFR, Estimated: 45 mL/min — ABNORMAL LOW
Glucose, Bld: 163 mg/dL — ABNORMAL HIGH (ref 70–99)
Potassium: 4.2 mmol/L (ref 3.5–5.1)
Sodium: 145 mmol/L (ref 135–145)
Total Bilirubin: 0.4 mg/dL (ref 0.0–1.2)
Total Protein: 6.4 g/dL — ABNORMAL LOW (ref 6.5–8.1)

## 2024-09-30 LAB — CBC
HCT: 33.2 % — ABNORMAL LOW (ref 39.0–52.0)
Hemoglobin: 10.9 g/dL — ABNORMAL LOW (ref 13.0–17.0)
MCH: 33.6 pg (ref 26.0–34.0)
MCHC: 32.8 g/dL (ref 30.0–36.0)
MCV: 102.5 fL — ABNORMAL HIGH (ref 80.0–100.0)
Platelets: 318 10*3/uL (ref 150–400)
RBC: 3.24 MIL/uL — ABNORMAL LOW (ref 4.22–5.81)
RDW: 13.2 % (ref 11.5–15.5)
WBC: 9.4 10*3/uL (ref 4.0–10.5)
nRBC: 0 % (ref 0.0–0.2)

## 2024-09-30 LAB — URINALYSIS, W/ REFLEX TO CULTURE (INFECTION SUSPECTED)
Bacteria, UA: NONE SEEN
Bilirubin Urine: NEGATIVE
Glucose, UA: NEGATIVE mg/dL
Ketones, ur: 5 mg/dL — AB
Nitrite: NEGATIVE
Protein, ur: 100 mg/dL — AB
Specific Gravity, Urine: 1.018 (ref 1.005–1.030)
WBC, UA: >50 WBC/hpf (ref 0–5)
pH: 5 (ref 5.0–8.0)

## 2024-09-30 LAB — RESP PANEL BY RT-PCR (RSV, FLU A&B, COVID)  RVPGX2
Influenza A by PCR: NEGATIVE
Influenza B by PCR: NEGATIVE
Resp Syncytial Virus by PCR: NEGATIVE
SARS Coronavirus 2 by RT PCR: NEGATIVE

## 2024-09-30 LAB — PROTIME-INR
INR: 1.3 — ABNORMAL HIGH (ref 0.8–1.2)
Prothrombin Time: 16.5 s — ABNORMAL HIGH (ref 11.4–15.2)

## 2024-09-30 LAB — MRSA NEXT GEN BY PCR, NASAL: MRSA by PCR Next Gen: DETECTED — AB

## 2024-09-30 LAB — LACTIC ACID, PLASMA
Lactic Acid, Venous: 1.7 mmol/L (ref 0.5–1.9)
Lactic Acid, Venous: 1.3 mmol/L (ref 0.5–1.9)

## 2024-09-30 MED ORDER — ZIPRASIDONE MESYLATE 20 MG IM SOLR
10.0000 mg | Freq: Once | INTRAMUSCULAR | Status: AC
  Administered 2024-09-30: 10 mg via INTRAMUSCULAR
  Filled 2024-09-30: qty 20

## 2024-09-30 MED ORDER — HALOPERIDOL LACTATE 5 MG/ML IJ SOLN: 1.0000 mg | Freq: Three times a day (TID) | INTRAMUSCULAR | Status: AC | PRN

## 2024-09-30 MED ORDER — SODIUM CHLORIDE 0.9 % IV SOLN
2.0000 g | Freq: Once | INTRAVENOUS | Status: AC
  Administered 2024-09-30: 2 g via INTRAVENOUS
  Filled 2024-09-30: qty 20

## 2024-09-30 MED ORDER — ACETAMINOPHEN 325 MG PO TABS: 650.0000 mg | ORAL_TABLET | Freq: Four times a day (QID) | ORAL | Status: AC | PRN

## 2024-09-30 MED ORDER — GLYCOPYRROLATE 0.2 MG/ML IJ SOLN: 0.2000 mg | INTRAMUSCULAR | Status: AC | PRN

## 2024-09-30 MED ORDER — LORATADINE 10 MG PO TABS
10.0000 mg | ORAL_TABLET | Freq: Every day | ORAL | Status: AC
  Filled 2024-09-30 (×2): qty 1

## 2024-09-30 MED ORDER — ENSURE PLUS HIGH PROTEIN PO LIQD: 237.0000 mL | Freq: Two times a day (BID) | ORAL | Status: AC

## 2024-09-30 MED ORDER — TAMSULOSIN HCL 0.4 MG PO CAPS
0.4000 mg | ORAL_CAPSULE | Freq: Every day | ORAL | Status: AC
  Administered 2024-09-30 – 2024-10-02 (×2): 0.4 mg via ORAL
  Filled 2024-09-30 (×2): qty 1

## 2024-09-30 MED ORDER — MELATONIN 3 MG PO TABS
6.0000 mg | ORAL_TABLET | Freq: Every day | ORAL | Status: AC
  Filled 2024-09-30 (×2): qty 2

## 2024-09-30 MED ORDER — TRAZODONE HCL 50 MG PO TABS: 50.0000 mg | ORAL_TABLET | Freq: Every evening | ORAL | Status: AC | PRN

## 2024-09-30 MED ORDER — ACETAMINOPHEN 650 MG RE SUPP: 650.0000 mg | Freq: Four times a day (QID) | RECTAL | Status: AC | PRN

## 2024-09-30 MED ORDER — GLYCOPYRROLATE 1 MG PO TABS: 1.0000 mg | ORAL_TABLET | ORAL | Status: AC | PRN

## 2024-09-30 MED ORDER — POLYVINYL ALCOHOL 1.4 % OP SOLN: 1.0000 [drp] | Freq: Four times a day (QID) | OPHTHALMIC | Status: AC | PRN

## 2024-09-30 MED ORDER — LORATADINE 10 MG PO TBDP: 10.0000 mg | ORAL_TABLET | Freq: Every day | ORAL | Status: DC

## 2024-09-30 MED ORDER — HALOPERIDOL LACTATE 5 MG/ML IJ SOLN
1.0000 mg | Freq: Three times a day (TID) | INTRAMUSCULAR | Status: AC | PRN
  Administered 2024-09-30 – 2024-10-01 (×2): 1 mg via INTRAVENOUS
  Filled 2024-09-30 (×2): qty 1

## 2024-09-30 MED ORDER — STERILE WATER FOR INJECTION IJ SOLN
INTRAMUSCULAR | Status: AC
  Administered 2024-09-30: 1.2 mL
  Filled 2024-09-30: qty 10

## 2024-09-30 MED ORDER — SODIUM CHLORIDE 0.9 % IV BOLUS
1000.0000 mL | Freq: Once | INTRAVENOUS | Status: AC
  Administered 2024-09-30: 1000 mL via INTRAVENOUS

## 2024-09-30 NOTE — H&P (Addendum)
 " History and Physical    Blake George FMW:[id number] DOB: Aug 17, 1928 DOA: 09/30/2024  DOS: the patient was seen and examined on 09/30/2024  PCP: Rosamond Leta NOVAK, MD   Patient coming from: SNF  I have personally briefly reviewed patient's old medical records in [facility]  History of Present Illness: 89 y/o M with PMH of prolonged delirium since 07/2024 (no official dementia diagnosis as never recovered after December admission to undergo dementia workup), recurrent UTIs, acute urinary retention status post chronic indwelling foley catheter, CVA, CHB s/p PPM (07/28/24), HTN, HLD, T2DM, CKD3b, and bilateral cochlear implants who presents from [facility] for worsening confusion. Patient unable to provide any history due to altered mentation. History was obtained from patient's spouse and son-in-law at bedside. Collateral information was obtained from chart review.  Patient has been dealing with waxing and waning delirium for the past few months since he was admitted at [facility] in 07/2024. Patient at that time underwent PPM placement, and treatment for UTI. That hospital course was complicated by prolonged delirium despite treatment of reversible causes, malnutrition and possible failure to thrive. Patient ultimately was able to eat some home food and when his mentation got a little better, he was discharged to [facility]. Thereafter, patient was transitioned to [facility] with the understanding that he is to remain comfort care only patient. Patient was later transferred to [facility] where he most recently was started on antibiotic injections for UTI 6 days ago.   Patient was sent to the hospital from SNF due to worsening confusion that could not be managed at facility despite treatment with antibiotics.  No notes on file   Review of Systems:  Review of Systems  Unable to perform ROS: Mental status change    Past Medical History:  Diagnosis Date   Arthritis    lower  back (06/10/2013)   Basal cell carcinoma    Right lower leg   Cochlear implant in place    bilaterally   Dizziness    episodic   Enlarged prostate    Gait disturbance    Headache(784.0)    Hearing deficit    deaf Left ear   Hearing loss    Cochlear implant   History of blood transfusion 2001   probably, related to abdominal OR (06/10/2013)   Hypertrophic acne scar    benign   Hypertrophy (benign) of prostate    benign prostatic   Leg lesion    Melanoma (HCC)    left retinal; took radiation for it ~ 2 yr ago (06/10/2013)   Pancreatic adenoma    status post resection   Pulmonary embolism (HCC) 2001   after big stomach OR; in hospital for 78 days (06/10/2013)   Renal insufficiency    chronic   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 (HCC)    Squamous cell carcinoma, leg    RLE/notes 06/10/2013   Stroke (HCC) ~ 2004; ~ 2012   auditory nerve,cerebellar artery stroke, left superior cerebellar artery; that's when I lost my hearing (06/10/2013)   Type II diabetes mellitus (HCC)    Vertigo    no problem past 2 yrs   Visual hallucinations 09/29/2020    Past Surgical History:  Procedure Laterality Date   ABDOMINAL SURGERY  2001   Partial colectomy/partial pancreatectomy / spenectomy   CATARACT EXTRACTION Left    COCHLEAR IMPLANT Bilateral 2006 / 2013   COCHLEAR IMPLANT REMOVAL Left 12/01/2019   COLECTOMY  2001  partial   LESION REMOVAL Right 06/05/2013   Procedure: LESION REMOVAL RIGHT LOWER LEG ;  Surgeon: Vicenta DELENA Poli, MD;  Location: [facility]S;  Service: General;  Laterality: Right;   MELANOMA EXCISION Left    Resected, left retina   PANCREATECTOMY  2001   partial   SPLENECTOMY, TOTAL  2001     reports that he quit smoking about 50 years ago. His smoking use included cigarettes. He started smoking about 70 years ago. He has a 2.4 pack-year smoking history. He has never used smokeless tobacco. He reports that he does not drink alcohol  and does not  use drugs.  Allergies[1]  Family History  Problem Relation Age of Onset   Heart attack Mother    Diabetes Mother    Stroke Father    Heart disease Brother    Diabetes Brother    Cancer - Prostate Brother     Prior to Admission medications  Medication Sig Start Date End Date Taking? Authorizing Provider  cyanocobalamin 1000 MCG tablet Take by mouth.    [provider]  dipyridamole -aspirin  (AGGRENOX ) 200-25 MG per 12 hr capsule Take 1 capsule by mouth 2 (two) times daily. 10/29/14   Onita Duos, MD  dutasteride  (AVODART ) 0.5 MG capsule TAKE 1 CAPSULE BY MOUTH EVERY MORNING 12/05/23   Watt Rush, MD  glipiZIDE (GLUCOTROL XL) 2.5 MG 24 hr tablet Take 2.5 mg by mouth daily with breakfast. 02/08/23   [provider]  glucose blood (TRUETRACK TEST) test strip  11/25/17   [provider]  loratadine  (CLARITIN  REDITABS) 10 MG dissolvable tablet Take 10 mg by mouth daily.    [provider]  Multiple Vitamins-Minerals (MULTIVITAMIN PO) Take 1 tablet by mouth daily.     [provider]  rosuvastatin (CRESTOR) 5 MG tablet Take 5 mg by mouth daily. 12/12/22   [provider]  tamsulosin  (FLOMAX ) 0.4 MG CAPS capsule TAKE 1 CAPSULE BY MOUTH DAILY AFTER SUPPER 02/14/23   Watt Rush, MD    Physical Exam: Vitals:   09/30/24 1000 09/30/24 1015 09/30/24 1230 09/30/24 1245  BP: (!) 104/50 (!) 111/52 (!) 138/99 (!) 169/157  Pulse: (!) 106 (!) 110 (!) 108 (!) 111  Resp: (!) 22 15 (!) 26 15  Temp:      TempSrc:      SpO2: 97% 97% 98% 97%  Weight:      Height:        Physical Exam Vitals and nursing note reviewed.  Constitutional:      General: He is not in acute distress.    Appearance: He is ill-appearing.     Comments: Weak, frail, cachectic  HENT:     Head: Normocephalic and atraumatic.  Cardiovascular:     Rate and Rhythm: Regular rhythm. Tachycardia present.     Pulses: Normal pulses.     Heart sounds: Normal heart sounds.  Pulmonary:      Effort: Pulmonary effort is normal.     Breath sounds: Normal breath sounds.  Abdominal:     General: Bowel sounds are normal.     Palpations: Abdomen is soft.  Musculoskeletal:     Right lower leg: No edema.     Left lower leg: No edema.  Neurological:     Mental Status: He is alert. He is disoriented.      Labs on Admission: I have personally reviewed following labs and imaging studies  CBC: Recent Labs  Lab 09/30/24 0955  WBC 9.4  HGB 10.9*  HCT 33.2*  MCV 102.5*  PLT 318   Basic Metabolic Panel: Recent Labs  Lab 09/30/24 0955  NA 145  K 4.2  CL 108  CO2 21*  GLUCOSE 163*  BUN 33*  CREATININE 1.45*  CALCIUM 9.0   GFR: Estimated Creatinine Clearance: 33.2 mL/min (A) (by C-G formula based on SCr of 1.45 mg/dL (H)). Liver Function Tests: Recent Labs  Lab 09/30/24 0955  AST 43*  ALT 34  ALKPHOS 243*  BILITOT 0.4  PROT 6.4*  ALBUMIN 3.2*   No results for input(s): LIPASE, AMYLASE in the last 168 hours. No results for input(s): AMMONIA in the last 168 hours. Coagulation Profile: Recent Labs  Lab 09/30/24 0955  INR 1.3*   Cardiac Enzymes: No results for input(s): CKTOTAL, CKMB, CKMBINDEX, TROPONINI, TROPONINIHS in the last 168 hours. ProBNP, BNP (last 5 results) No results for input(s): PROBNP, BNP in the last 8760 hours. HbA1C: No results for input(s): HGBA1C in the last 72 hours. CBG: No results for input(s): GLUCAP in the last 168 hours. Lipid Profile: No results for input(s): CHOL, HDL, LDLCALC, TRIG, CHOLHDL, LDLDIRECT in the last 72 hours. Thyroid  Function Tests: No results for input(s): TSH, T4TOTAL, FREET4, T3FREE, THYROIDAB in the last 72 hours. Anemia Panel: No results for input(s): VITAMINB12, FOLATE, FERRITIN, TIBC, IRON, RETICCTPCT in the last 72 hours. Urine analysis:    Component Value Date/Time   COLORURINE YELLOW 09/30/2024 1050   APPEARANCEUR CLOUDY (A) 09/30/2024  1050   APPEARANCEUR Clear 03/14/2023 1009   LABSPEC 1.018 09/30/2024 1050   PHURINE 5.0 09/30/2024 1050   GLUCOSEU NEGATIVE 09/30/2024 1050   HGBUR MODERATE (A) 09/30/2024 1050   BILIRUBINUR NEGATIVE 09/30/2024 1050   BILIRUBINUR Negative 03/14/2023 1009   KETONESUR 5 (A) 09/30/2024 1050   PROTEINUR 100 (A) 09/30/2024 1050   UROBILINOGEN 0.2 12/11/2019 1431   NITRITE NEGATIVE 09/30/2024 1050   LEUKOCYTESUR LARGE (A) 09/30/2024 1050    Radiological Exams on Admission: I have personally reviewed images DG Chest Port 1 View Result Date: 09/30/2024 EXAM: 1 VIEW XRAY OF THE CHEST 09/30/2024 09:58:48 AM COMPARISON: 05/29/2013. CLINICAL HISTORY: Query sepsis; evaluation for abnormality. FINDINGS: LUNGS AND PLEURA: No focal pulmonary opacity. No pleural effusion. No pneumothorax. HEART AND MEDIASTINUM: Left pacemaker interval placement. Progressive Aortic atherosclerosis. BONES AND SOFT TISSUES: No acute osseous abnormality. IMPRESSION: 1. No acute findings. Electronically signed by: Dayne Hassell MD 09/30/2024 12:55 PM EST RP Workstation: [id number]   CT Head Wo Contrast Result Date: 09/30/2024 EXAM: CT HEAD WITHOUT CONTRAST 09/30/2024 11:42:58 AM TECHNIQUE: CT of the head was performed without the administration of intravenous contrast. Automated exposure control, iterative reconstruction, and/or weight based adjustment of the mA/kV was utilized to reduce the radiation dose to as low as reasonably achievable. COMPARISON: CT head 12/12/2020. CLINICAL HISTORY: 89 year old male. Increasing confusion, headache, possible sepsis. FINDINGS: BRAIN AND VENTRICLES: No acute hemorrhage. No evidence of acute infarct. No hydrocephalus. No extra-axial collection. No mass effect or midline shift. Volume has not significantly changed, is within normal limits for age. No suspicious intracranial vascular hyperdensity. Differentiation stable and within normal limits for age. Calcified atherosclerosis at the skull base.  ORBITS: No gaze deviation. SINUSES: Visible paranasal sinuses remain well aerated. SOFT TISSUES AND SKULL: Chronic right mastoidectomy and cochlear implant appears stable from previous CT. Associated streak artifact. Contralateral chronic left side canal down mastoidectomy with opacified surgical cavity also appears stable. No acute soft tissue abnormality. No skull fracture. IMPRESSION: 1. No acute intracranial abnormality. 2. Right cochlear implant and  chronic bilateral temporal bone surgical changes are stable. Electronically signed by: Helayne Hurst MD 09/30/2024 11:52 AM EST RP Workstation: [id number]    EKG: My personal interpretation of EKG shows: sinus or supraventricular tachycardia, nonspecific, IVCD, Qtc 518  Assessment/Plan Principal Problem:   Acute encephalopathy    Assessment and Plan:  89 y/o M with PMH of prolonged delirium since 07/2024 (no official dementia diagnosis as never recovered after December admission to undergo dementia workup), recurrent UTIs, acute urinary retention status post chronic indwelling foley catheter, CVA, CHB s/p PPM (07/28/24), HTN, HLD, T2DM, CKD3b, and bilateral cochlear implants who presents from [facility] for worsening confusion.  Acute encephalopathy Etiology unclear, possibilities include persistent complicated UTI, prolonged delirium superimposed on undiagnosed dementia UA concerning for persistent UTI CT head unremarkable No overt metabolic derangements  Given 1L NS and ceftriaxone  in the ED Hold off on IV fluids and antibiotics  As per family, patient previously responded poorly to Ativan with worsening agitation Consider haldol  PRN for acute agitation Start melatonin at bedtime for insomnia Start trazodone  PRN for insomnia Encourage oral intake/hydration for comfort Monitor clinically  BPH Chronic indwelling foley catheter Given patient is comfort care only, will not exchange foley catheter and maintain for comfort    Chronic  dysphagia As per wife, patient tolerates mechanical soft diet Modified diet ordered for comfort Monitor clinically  Advance care planning Discussed prior goals of care extensively with spouse and son-in-law at bedside. Family have always wishes for the patient to be comfort care only. Spouse does not have the prior MOST form with them at this time. Family are frustrated about his continued trajectory of going back and forth among different outpatient facilities (SNF, Hospice) and returning back to hospital. Family understand that his prognosis is poor and his prolonged delirium may never improve despite aggressive. Family in agreement to maintain comfort care measures at this time with specific limitations including DNR/DNI, no antibiotics, no IV fluids, no feeding tubes, no dialysis, no transfers to hospitals unless symptoms cannot be managed in the outpatient setting. New MOST form signed with spouse by me at bedside today. Will get palliative care team consult for hospice evaluation Will get [facility] team consult for disposition  I spent a total of 40 minutes discussing goals of care with patient's wife and son-in-law in person at bedside today.   Stop home vitamins, antibiotics, CVA secondary prevention meds,   DVT prophylaxis: None, comfort care only Code Status: Comfort Care Family Communication: updated spouse and son-in-law at bedside  Disposition Plan: Hospice  Consults called: Palliative care, TOC team  Admission status: Inpatient, Med-Surg  Norval Bar, MD [facility] 09/30/2024, 3:27 PM       [1]  Allergies Allergen Reactions   Diamode [Loperamide Hcl]     Reaction unknown   Ivp Dye [Iodinated Contrast Media]     Reaction unknown   Morphine And Codeine     Reaction unknown   Topiramate Other (See Comments)    Reaction unknown Reaction unknown   Ciprofloxacin Other (See Comments)    Loss of appetite, problems sleeping   Zetia [Ezetimibe]     family does not  remember   Bacitra-Neomycin-Polymyxin-Hc Anxiety    Had anxiety and insomnia with cortisporin otic drops.    Clobetasol Propionate Rash   Ofloxacin Anxiety    Caused anxiety and insomnia   Plavix [Clopidogrel Bisulfate]     Reaction unknown   "

## 2024-09-30 NOTE — Sepsis Progress Note (Signed)
 Elink will follow per sepsis protocol.

## 2024-09-30 NOTE — ED Notes (Signed)
 Pt wife and son in law at bedside requesting to be called for any updates while they are out to eat. Eloise [phone]

## 2024-09-30 NOTE — ED Provider Notes (Signed)
 " [place] [facility]PART[facility] AT [facility] Provider Note   CSN: [id number] Arrival date & time: 09/30/24  9095     Patient presents with: AMS   Blake George is a 89 y.o. male.  {Add pertinent medical, surgical, social history, OB history to YEP:67052} Patient has a history of dementia and has been more confused and combative lately.   Altered Mental Status      Prior to Admission medications  Medication Sig Start Date End Date Taking? Authorizing Provider  cyanocobalamin 1000 MCG tablet Take by mouth.    [provider]  dipyridamole -aspirin  (AGGRENOX ) 200-25 MG per 12 hr capsule Take 1 capsule by mouth 2 (two) times daily. 10/29/14   Onita Duos, MD  dutasteride  (AVODART ) 0.5 MG capsule TAKE 1 CAPSULE BY MOUTH EVERY MORNING 12/05/23   Watt Rush, MD  glipiZIDE (GLUCOTROL XL) 2.5 MG 24 hr tablet Take 2.5 mg by mouth daily with breakfast. 02/08/23   [provider]  glucose blood (TRUETRACK TEST) test strip  11/25/17   [provider]  loratadine  (CLARITIN  REDITABS) 10 MG dissolvable tablet Take 10 mg by mouth daily.    [provider]  Multiple Vitamins-Minerals (MULTIVITAMIN PO) Take 1 tablet by mouth daily.     [provider]  rosuvastatin (CRESTOR) 5 MG tablet Take 5 mg by mouth daily. 12/12/22   [provider]  tamsulosin  (FLOMAX ) 0.4 MG CAPS capsule TAKE 1 CAPSULE BY MOUTH DAILY AFTER SUPPER 02/14/23   Watt Rush, MD    Allergies: Diamode [loperamide hcl], Ivp dye [iodinated contrast media], Morphine and codeine, Topiramate, Ciprofloxacin, Zetia [ezetimibe], Bacitra-neomycin-polymyxin-hc, Clobetasol propionate, Ofloxacin, and Plavix [clopidogrel bisulfate]    Review of Systems  Updated Vital Signs BP (!) 169/157   Pulse (!) 111   Temp 98.8 F (37.1 C) (Rectal)   Resp 15   Ht 5' 11 (1.803 m)   Wt 78 kg   SpO2 97%   BMI 23.98 kg/m   Physical Exam  (all labs ordered are listed, but only abnormal  results are displayed) Labs Reviewed  COMPREHENSIVE METABOLIC PANEL WITH GFR - Abnormal; Notable for the following components:      Result Value   CO2 21 (*)    Glucose, Bld 163 (*)    BUN 33 (*)    Creatinine, Ser 1.45 (*)    Total Protein 6.4 (*)    Albumin 3.2 (*)    AST 43 (*)    Alkaline Phosphatase 243 (*)    GFR, Estimated 45 (*)    Anion gap 17 (*)    All other components within normal limits  URINALYSIS, W/ REFLEX TO CULTURE (INFECTION SUSPECTED) - Abnormal; Notable for the following components:   APPearance CLOUDY (*)    Hgb urine dipstick MODERATE (*)    Ketones, ur 5 (*)    Protein, ur 100 (*)    Leukocytes,Ua LARGE (*)    All other components within normal limits  CBC - Abnormal; Notable for the following components:   RBC 3.24 (*)    Hemoglobin 10.9 (*)    HCT 33.2 (*)    MCV 102.5 (*)    All other components within normal limits  PROTIME-INR - Abnormal; Notable for the following components:   Prothrombin Time 16.5 (*)    INR 1.3 (*)    All other components within normal limits  RESP PANEL BY RT-PCR (RSV, FLU A&B, COVID)  RVPGX2  CULTURE, BLOOD (ROUTINE X 2)  CULTURE, BLOOD (ROUTINE  X 2)  URINE CULTURE  LACTIC ACID, PLASMA  LACTIC ACID, PLASMA  CBC WITH DIFFERENTIAL/PLATELET    EKG: None  Radiology: DG Chest Port 1 View Result Date: 09/30/2024 EXAM: 1 VIEW XRAY OF THE CHEST 09/30/2024 09:58:48 AM COMPARISON: 05/29/2013. CLINICAL HISTORY: Query sepsis; evaluation for abnormality. FINDINGS: LUNGS AND PLEURA: No focal pulmonary opacity. No pleural effusion. No pneumothorax. HEART AND MEDIASTINUM: Left pacemaker interval placement. Progressive Aortic atherosclerosis. BONES AND SOFT TISSUES: No acute osseous abnormality. IMPRESSION: 1. No acute findings. Electronically signed by: Dayne Hassell MD 09/30/2024 12:55 PM EST RP Workstation: [id number]   CT Head Wo Contrast Result Date: 09/30/2024 EXAM: CT HEAD WITHOUT CONTRAST 09/30/2024 11:42:58 AM TECHNIQUE: CT of  the head was performed without the administration of intravenous contrast. Automated exposure control, iterative reconstruction, and/or weight based adjustment of the mA/kV was utilized to reduce the radiation dose to as low as reasonably achievable. COMPARISON: CT head 12/12/2020. CLINICAL HISTORY: 89 year old male. Increasing confusion, headache, possible sepsis. FINDINGS: BRAIN AND VENTRICLES: No acute hemorrhage. No evidence of acute infarct. No hydrocephalus. No extra-axial collection. No mass effect or midline shift. Volume has not significantly changed, is within normal limits for age. No suspicious intracranial vascular hyperdensity. Differentiation stable and within normal limits for age. Calcified atherosclerosis at the skull base. ORBITS: No gaze deviation. SINUSES: Visible paranasal sinuses remain well aerated. SOFT TISSUES AND SKULL: Chronic right mastoidectomy and cochlear implant appears stable from previous CT. Associated streak artifact. Contralateral chronic left side canal down mastoidectomy with opacified surgical cavity also appears stable. No acute soft tissue abnormality. No skull fracture. IMPRESSION: 1. No acute intracranial abnormality. 2. Right cochlear implant and chronic bilateral temporal bone surgical changes are stable. Electronically signed by: Helayne Hurst MD 09/30/2024 11:52 AM EST RP Workstation: [id number]    {Document cardiac monitor, telemetry assessment procedure when appropriate:32947} Procedures   Medications Ordered in the ED  ziprasidone  (GEODON ) injection 10 mg (10 mg Intramuscular Given 09/30/24 0928)  sodium chloride  0.9 % bolus 1,000 mL (0 mLs Intravenous Stopped 09/30/24 1220)  cefTRIAXone  (ROCEPHIN ) 2 g in sodium chloride  0.9 % 100 mL IVPB (0 g Intravenous Stopped 09/30/24 1118)  sterile water  (preservative free) injection (1.2 mLs  Given 09/30/24 0934)      {Click here for ABCD2, HEART and other calculators REFRESH Note before signing:1}                               Medical Decision Making Amount and/or Complexity of Data Reviewed Labs: ordered. Radiology: ordered.  Risk Prescription drug management. Decision regarding hospitalization.   Patient with UTI, dehydration, metabolic encephalopathy.  Patient will be admitted to medicine  {Document critical care time when appropriate  Document review of labs and clinical decision tools ie CHADS2VASC2, etc  Document your independent review of radiology images and any outside records  Document your discussion with family members, caretakers and with consultants  Document social determinants of health affecting pt's care  Document your decision making why or why not admission, treatments were needed:32947:::1}   Final diagnoses:  Acute cystitis with hematuria  Dehydration  Acute metabolic encephalopathy    ED Discharge Orders     None        "

## 2024-09-30 NOTE — ED Triage Notes (Addendum)
 Pt arrived via [facility] from [facility]. Pt presents with severe confusion with a history of dementia. [facility] called EMS for transport because pt's CBG was originally 56. Pt given a tube of oral glucose and sugar improved to 241. Pt proceeding to grab and kick at staff and is unresponsive to redirection. Pt has a foley cath in place with yellow cloudy thick discharge inside catheter bag. Pt currently being treated for UTI and today was supposed to be his last day of antibiotics.  Pt's wife is coming to hospital per EMS. MD at bedside during triage.

## 2024-10-01 MED ORDER — CHLORHEXIDINE GLUCONATE CLOTH 2 % EX PADS
6.0000 | MEDICATED_PAD | Freq: Every day | CUTANEOUS | Status: AC
  Administered 2024-10-01 – 2024-10-02 (×2): 6 via TOPICAL

## 2024-10-01 MED ORDER — MUPIROCIN 2 % EX OINT
1.0000 | TOPICAL_OINTMENT | Freq: Two times a day (BID) | CUTANEOUS | Status: AC
  Administered 2024-10-01 – 2024-10-02 (×2): 1 via NASAL
  Filled 2024-10-01: qty 22

## 2024-10-01 NOTE — Plan of Care (Signed)
  Problem: Education: Goal: Knowledge of General Education information will improve Description: Including pain rating scale, medication(s)/side effects and non-pharmacologic comfort measures Outcome: Not Progressing   Problem: Health Behavior/Discharge Planning: Goal: Ability to manage health-related needs will improve Outcome: Not Progressing   Problem: Clinical Measurements: Goal: Ability to maintain clinical measurements within normal limits will improve Outcome: Not Progressing Goal: Will remain free from infection Outcome: Not Progressing Goal: Diagnostic test results will improve Outcome: Not Progressing Goal: Respiratory complications will improve Outcome: Not Progressing Goal: Cardiovascular complication will be avoided Outcome: Not Progressing   Problem: Activity: Goal: Risk for activity intolerance will decrease Outcome: Not Progressing   Problem: Nutrition: Goal: Adequate nutrition will be maintained Outcome: Not Progressing   Problem: Coping: Goal: Level of anxiety will decrease Outcome: Not Progressing   Problem: Elimination: Goal: Will not experience complications related to bowel motility Outcome: Not Progressing Goal: Will not experience complications related to urinary retention Outcome: Not Progressing   Problem: Pain Managment: Goal: General experience of comfort will improve and/or be controlled Outcome: Not Progressing

## 2024-10-01 NOTE — Consult Note (Signed)
 "                              Consultation Note Date: 10/01/2024   Patient Name: Blake George  DOB: [birth year]  MRN: [id number]  Age / Sex: 89 y.o., male  PCP: Rosamond Leta NOVAK, MD Referring Physician: Cosette Blackwater, MD  Reason for Consultation: Establishing goals of care   HPI/Patient Profile: 89 y.o. male  with past medical history of prolonged delirium since 07/2024 (no official dementia diagnosis as never recovered after December admission to undergo dementia workup), recurrent UTIs, acute urinary retention status post chronic indwelling foley catheter, CVA, CHB s/p PPM (07/28/24), HTN, HLD, T2DM, CKD3b, Choroidal melanoma, left eye, and bilateral cochlear implants admitted on 09/30/2024 with worsening confusion.  Inpatient notes reviewed including hospitalist note date of service 09/30/2024.  Patient has been admitted for acute encephalopathy in setting of prolonged delirium superimposed on undiagnosed dementia.  Advance care planning discussion has been completed.  Patient confirmed to be a comfort care only and expressed frustration with going back and forth to various settings i.e. SNF, hospice, now the hospital.  Outside records reviewed.  I see where patient was hospitalized at [facility] health from 0/08/2023 through 08/06/2024.  He was hospitalized for high-grade AV block with some associated dizziness, lightheadedness, nausea, and abdominal pain.  Noted to have failure to thrive and persistent delirium in the setting of acute metabolic encephalopathy possibly related to underlying neurocognitive disorder.  Palliative care evaluated him.  He made some improvement but continued to have persistent confusion.  It appears skilled therapy and a nursing facility was recommended.  He was discharged to [facility] and rehab.  Prolonged hospitalization with persistent delirium paints a picture of poor overall prognosis.  PMT has been consulted to assist with goals of care conversation.  Clinical  Assessment and Goals of Care:  I have reviewed medical records including EPIC notes, labs and imaging (independently reviewed), ***, assessed the patient and then *** with *** to discuss diagnosis prognosis, GOC, EOL wishes, disposition and options. Collaborated directly with ***.   I introduced Palliative Medicine as specialized medical care for people living with serious illness. It focuses on providing relief from the symptoms and stress of a serious illness. The goal is to improve quality of life for both the patient and the family.  We discussed a brief life review of the patient and then focused on their current illness. ***  I attempted to elicit values and goals of care important to the patient.    Medical History Review and Family/Patient Understanding:   ***  Social History:  ***  Functional and Nutritional State:  ***  Palliative Symptoms:  ***  Advance Directives/Goals of Care/Anticipatory care planning Discussion:  A detailed discussion regarding GOC, advanced directives, and anticipatory care planning was had. ***    The difference between aggressive medical intervention and comfort care was considered in light of the patient's goals of care. Hospice and Palliative Care services outpatient were explained and offered.   Discussed the importance of continued conversation with family and the medical providers regarding overall plan of care and treatment options, ensuring decisions are within the context of the patients values and GOCs.   Questions and concerns were addressed.  Hard Choices booklet left for review. The family was encouraged to call with questions or concerns.  PMT will continue to support holistically.  Primary Decision maker and health care surrogate:  {  Primary Decision Fjxzm:78612}  Code Status:  ***    SUMMARY OF RECOMMENDATIONS    Assessment and Plan Assessment & Plan       Code Status/Advance Care Planning: {Palliative Code  status:23503}   Symptom Management:  ***  Palliative Prophylaxis:  {Palliative Prophylaxis:21015}  Additional Recommendations (Limitations, Scope, Preferences): {Recommended Scope and Preferences:21019}  Psycho-social/Spiritual:  Desire for further Chaplaincy support:{YES NO:22349} Additional Recommendations: {PAL SOCIAL:21064}  Prognosis:  {Palliative Care Prognosis:23504}  Discharge Planning: {Palliative dispostion:23505}      Primary Diagnoses: Present on Admission:  Acute encephalopathy    Physical Exam  Vital Signs: BP (!) 106/41 (BP Location: Left Arm)   Pulse 67   Temp 97.9 F (36.6 C) (Oral)   Resp 18   Ht 5' 11 (1.803 m)   Wt 78 kg   SpO2 97%   BMI 23.98 kg/m  Pain Scale: 0-10   Pain Score: 0-No pain   SpO2: SpO2: 97 % O2 Device:SpO2: 97 % O2 Flow Rate: .    Palliative Assessment/Data:    Total time:  I personally spent a total of *** minutes in the care of the patient today including {Time Based Coding:210964241}.   Billing based on MDM: ***  {Problems Addressed:304933}  {Amount and/or Complexity of Ijuj:695065}  {Risks:304936}   Laymon CHRISTELLA Pinal, NP  Palliative Medicine Team Team phone # [phone]  Thank you for allowing the Palliative Medicine Team to assist in the care of this patient. Please utilize secure chat with additional questions, if there is no response within 30 minutes please call the above phone number.  Palliative Medicine Team providers are available by phone from 7am to 7pm daily and can be reached through the team cell phone.  Should this patient require assistance outside of these hours, please call the patient's attending physician.    "

## 2024-10-01 NOTE — Progress Notes (Incomplete)
 Lab called pts swab test came back +MRSA.

## 2024-10-01 NOTE — TOC Initial Note (Signed)
 Transition of Care [facility]) - Initial/Assessment Note    Patient Details  Name: Blake George MRN: [id number] Date of Birth: [birth year]  [facility] CM/SW Contact:    Lucie Lunger, LCSWA Phone Number: 10/01/2024, 1:37 PM  Clinical Narrative:                 CSW notes per chart review that pt arrived to hospital from [facility]. CSW spoke with Ozell in admissions who confirms that pt has been at their facility for SNF. CSW spoke to Elizaville with [facility] who states pt was at [facility] for residential hospice but discharged to [facility] on 1/5. CSW updated MD and palliative NP of this. D/C planning is ongoing at this time. TOC to follow.   Expected Discharge Plan:  (unkown at this time) Barriers to Discharge: Continued Medical Work up   Patient Goals and CMS Choice Patient states their goals for this hospitalization and ongoing recovery are:: working on plan   Choice offered to / list presented to : Spouse, Adult Children      Expected Discharge Plan and Services In-house Referral: Clinical Social Work Discharge Planning Services: CM Consult   Living arrangements for the past 2 months: Skilled Nursing Facility                                      Prior Living Arrangements/Services Living arrangements for the past 2 months: Skilled Nursing Facility Lives with:: Facility Resident Patient language and need for interpreter reviewed:: Yes Do you feel safe going back to the place where you live?: Yes      Need for Family Participation in Patient Care: Yes (Comment) Care giver support system in place?: Yes (comment)   Criminal Activity/Legal Involvement Pertinent to Current Situation/Hospitalization: No - Comment as needed  Activities of Daily Living   ADL Screening (condition at time of admission) Independently performs ADLs?: No Does the patient have a NEW difficulty with bathing/dressing/toileting/self-feeding that is expected to last >3 days?:  No Does the patient have a NEW difficulty with getting in/out of bed, walking, or climbing stairs that is expected to last >3 days?: No Does the patient have a NEW difficulty with communication that is expected to last >3 days?: No Is the patient deaf or have difficulty hearing?: Yes Does the patient have difficulty seeing, even when wearing glasses/contacts?: Yes Does the patient have difficulty concentrating, remembering, or making decisions?: Yes  Permission Sought/Granted                  Emotional Assessment Appearance:: Appears stated age         Psych Involvement: No (comment)  Admission diagnosis:  Dehydration [E86.0] Acute cystitis with hematuria [N30.01] Acute encephalopathy [G93.40] Acute metabolic encephalopathy [G93.41] Patient Active Problem List   Diagnosis Date Noted   Acute encephalopathy 09/30/2024   Bilateral lower extremity edema 03/14/2023   Bilateral sensorineural hearing loss 03/13/2023   Visual hallucinations 09/29/2020   History of left mastoidectomy 03/25/2020   Benign non-nodular prostatic hyperplasia with lower urinary tract symptoms 12/04/2019   Elevated prostate specific antigen (PSA) 12/04/2019   Family history of malignant neoplasm of prostate 12/04/2019   Nocturia 12/04/2019   Urinary urgency 12/04/2019   Weak urinary stream 12/04/2019   Urge incontinence of urine 12/04/2019   Diabetes mellitus type 2 without retinopathy (HCC) 07/02/2017   Ischemic optic neuropathy of left eye  07/02/2017   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 [place] [address] Phone: [phone] Fax: [phone]     Social Drivers of Health (SDOH) Social History: SDOH Screenings   Food Insecurity: No Food Insecurity (09/30/2024)  Housing: High Risk (09/30/2024)  Transportation Needs: Unmet Transportation Needs (09/30/2024)  Utilities: Not At Risk (09/30/2024)  Social Connections: Unknown (09/30/2024)  Stress: No Stress Concern Present (07/28/2024)   Received from [facility]  Tobacco Use: Medium Risk (09/30/2024)   SDOH Interventions:     Readmission Risk Interventions    10/01/2024    1:34 PM  Readmission Risk Prevention Plan  Transportation Screening Complete  Home Care Screening Complete  Medication Review (RN CM) Complete

## 2024-10-01 NOTE — Progress Notes (Signed)
 " PROGRESS NOTE    Blake George  FMW:[id number] DOB: 1928-06-27 DOA: 09/30/2024 PCP: Rosamond Leta NOVAK, MD  Subjective: No acute events overnight. Seen and examined at bedside with wife and son-in-law present. Unable to provide any history due to altered mentation.   Hospital Course:  89 y/o M with PMH of prolonged delirium since 07/2024 (no official dementia diagnosis as never recovered after December admission to undergo dementia workup), recurrent UTIs, acute urinary retention status post chronic indwelling foley catheter, CVA, CHB s/p PPM (07/28/24), HTN, HLD, T2DM, CKD3b, and bilateral cochlear implants who presents from [facility] for worsening confusion. Patient unable to provide any history due to altered mentation. History was obtained from patient's spouse and son-in-law at bedside. Collateral information was obtained from chart review.   Patient has been dealing with waxing and waning delirium for the past few months since he was admitted at [facility] in 07/2024. Patient at that time underwent PPM placement, and treatment for UTI. That hospital course was complicated by prolonged delirium despite treatment of reversible causes, malnutrition and possible failure to thrive. Patient ultimately was able to eat some home food and when his mentation got a little better, he was discharged to [facility]. Thereafter, patient was transitioned to [facility] with the understanding that he is to remain comfort care only patient. Patient was later transferred to [facility] where he most recently was started on antibiotic injections for UTI 6 days ago.    Patient was sent to the hospital from SNF due to worsening confusion that could not be managed at facility despite treatment with antibiotics. Transitioned back to comfort care as per family wishes.    Assessment and Plan:  89 y/o M with PMH of prolonged delirium since 07/2024 (no official dementia diagnosis as never recovered after  December admission to undergo dementia workup), recurrent UTIs, acute urinary retention status post chronic indwelling foley catheter, CVA, CHB s/p PPM (07/28/24), HTN, HLD, T2DM, CKD3b, and bilateral cochlear implants who presents from [facility] for worsening confusion.   Acute encephalopathy Etiology unclear, possibilities include persistent complicated UTI, prolonged delirium superimposed on undiagnosed dementia UA concerning for persistent UTI CT head unremarkable No overt metabolic derangements  Given 1L NS and ceftriaxone  in the ED Hold off on IV fluids and antibiotics  As per family, patient previously responded poorly to Ativan with worsening agitation Cont haldol  PRN for acute agitation Cont melatonin at bedtime for insomnia Cont trazodone  PRN for insomnia Encourage oral intake/hydration for comfort Monitor clinically   BPH Chronic indwelling foley catheter Given patient is comfort care only, will not exchange foley catheter and maintain for comfort     Chronic dysphagia As per wife, patient tolerates mechanical soft diet Modified diet ordered for comfort Monitor clinically  DVT prophylaxis: None. Comfort care only  None because of comfort care   Code Status: Do not attempt resuscitation (DNR) - Comfort care Family Communication: updated wife and son-in-law at bedside Disposition Plan: TBD Reason for continuing need for hospitalization: medically ready, palliative recommendations, possible hospice evaluation, TOC helping with disposition  Objective: Vitals:   09/30/24 1700 09/30/24 1732 09/30/24 2140 10/01/24 0009  BP: (!) 154/110 (!) 126/54 (!) 120/56 (!) 106/41  Pulse:  (!) 108 (!) 103 67  Resp: 16 18 18 18   Temp:  98.9 F (37.2 C) 98.3 F (36.8 C) 97.9 F (36.6 C)  TempSrc:  Oral  Oral  SpO2:  99% 99% 97%  Weight:  Height:        Intake/Output Summary (Last 24 hours) at 10/01/2024 1419 Last data filed at 10/01/2024 0935 Gross per 24 hour  Intake  0 ml  Output 425 ml  Net -425 ml   Filed Weights   09/30/24 0922  Weight: 78 kg    Examination:  Physical Exam Vitals and nursing note reviewed.  Constitutional:      General: He is not in acute distress.    Appearance: He is ill-appearing.     Comments: Somnolent, hard of hearing, weak, frail, cachectic  HENT:     Head: Normocephalic and atraumatic.  Cardiovascular:     Rate and Rhythm: Normal rate and regular rhythm.     Pulses: Normal pulses.     Heart sounds: Normal heart sounds.  Pulmonary:     Effort: Pulmonary effort is normal. No respiratory distress.     Breath sounds: Normal breath sounds. No wheezing.  Abdominal:     General: Bowel sounds are normal.     Palpations: Abdomen is soft.  Neurological:     Mental Status: He is disoriented.     Data Reviewed: I have personally reviewed following labs and imaging studies  CBC: Recent Labs  Lab 09/30/24 0955  WBC 9.4  HGB 10.9*  HCT 33.2*  MCV 102.5*  PLT 318   Basic Metabolic Panel: Recent Labs  Lab 09/30/24 0955  NA 145  K 4.2  CL 108  CO2 21*  GLUCOSE 163*  BUN 33*  CREATININE 1.45*  CALCIUM 9.0   GFR: Estimated Creatinine Clearance: 33.2 mL/min (A) (by C-G formula based on SCr of 1.45 mg/dL (H)). Liver Function Tests: Recent Labs  Lab 09/30/24 0955  AST 43*  ALT 34  ALKPHOS 243*  BILITOT 0.4  PROT 6.4*  ALBUMIN 3.2*   No results for input(s): LIPASE, AMYLASE in the last 168 hours. No results for input(s): AMMONIA in the last 168 hours. Coagulation Profile: Recent Labs  Lab 09/30/24 0955  INR 1.3*   Cardiac Enzymes: No results for input(s): CKTOTAL, CKMB, CKMBINDEX, TROPONINI in the last 168 hours. ProBNP, BNP (last 5 results) No results for input(s): PROBNP, BNP in the last 8760 hours. HbA1C: No results for input(s): HGBA1C in the last 72 hours. CBG: No results for input(s): GLUCAP in the last 168 hours. Lipid Profile: No results for input(s):  CHOL, HDL, LDLCALC, TRIG, CHOLHDL, LDLDIRECT in the last 72 hours. Thyroid  Function Tests: No results for input(s): TSH, T4TOTAL, FREET4, T3FREE, THYROIDAB in the last 72 hours. Anemia Panel: No results for input(s): VITAMINB12, FOLATE, FERRITIN, TIBC, IRON, RETICCTPCT in the last 72 hours. Sepsis Labs: Recent Labs  Lab 09/30/24 0955 09/30/24 1119  LATICACIDVEN 1.7 1.3    Recent Results (from the past 240 hours)  Blood Culture (routine x 2)     Status: None (Preliminary result)   Collection Time: 09/30/24  9:55 AM   Specimen: BLOOD  Result Value Ref Range Status   Specimen Description BLOOD BLOOD RIGHT ARM  Final   Special Requests   Final    BOTTLES DRAWN AEROBIC AND ANAEROBIC Blood Culture adequate volume   Culture   Final    NO GROWTH < 24 HOURS Performed at [facility], [address], [place], [place] [address]    Report Status PENDING  Incomplete  Resp panel by RT-PCR (RSV, Flu A&B, Covid) Urine, Catheterized     Status: None   Collection Time: 09/30/24 10:50 AM   Specimen: Urine, Catheterized; Nasal Swab  Result Value Ref Range Status   SARS Coronavirus 2 by RT PCR NEGATIVE NEGATIVE Final    Comment: (NOTE) SARS-CoV-2 target nucleic acids are NOT DETECTED.  The SARS-CoV-2 RNA is generally detectable in upper respiratory specimens during the acute phase of infection. The lowest concentration of SARS-CoV-2 viral copies this assay can detect is 138 copies/mL. A negative result does not preclude SARS-Cov-2 infection and should not be used as the sole basis for treatment or other patient management decisions. A negative result may occur with  improper specimen collection/handling, submission of specimen other than nasopharyngeal swab, presence of viral mutation(s) within the areas targeted by this assay, and inadequate number of viral copies(<138 copies/mL). A negative result must be combined with clinical observations, patient history,  and epidemiological information. The expected result is Negative.  Fact Sheet for Patients:  bloggercourse.com  Fact Sheet for Healthcare Providers:  seriousbroker.it  This test is no t yet approved or cleared by the [place]  FDA and  has been authorized for detection and/or diagnosis of SARS-CoV-2 by FDA under an Emergency Use Authorization (EUA). This EUA will remain  in effect (meaning this test can be used) for the duration of the COVID-19 declaration under Section 564(b)(1) of the Act, 21 U.S.C.section 360bbb-3(b)(1), unless the authorization is terminated  or revoked sooner.       Influenza A by PCR NEGATIVE NEGATIVE Final   Influenza B by PCR NEGATIVE NEGATIVE Final    Comment: (NOTE) The Xpert Xpress SARS-CoV-2/FLU/RSV plus assay is intended as an aid in the diagnosis of influenza from Nasopharyngeal swab specimens and should not be used as a sole basis for treatment. Nasal washings and aspirates are unacceptable for Xpert Xpress SARS-CoV-2/FLU/RSV testing.  Fact Sheet for Patients: bloggercourse.com  Fact Sheet for Healthcare Providers: seriousbroker.it  This test is not yet approved or cleared by the [place]  FDA and has been authorized for detection and/or diagnosis of SARS-CoV-2 by FDA under an Emergency Use Authorization (EUA). This EUA will remain in effect (meaning this test can be used) for the duration of the COVID-19 declaration under Section 564(b)(1) of the Act, 21 U.S.C. section 360bbb-3(b)(1), unless the authorization is terminated or revoked.     Resp Syncytial Virus by PCR NEGATIVE NEGATIVE Final    Comment: (NOTE) Fact Sheet for Patients: bloggercourse.com  Fact Sheet for Healthcare Providers: seriousbroker.it  This test is not yet approved or cleared by the [place]  FDA and has  been authorized for detection and/or diagnosis of SARS-CoV-2 by FDA under an Emergency Use Authorization (EUA). This EUA will remain in effect (meaning this test can be used) for the duration of the COVID-19 declaration under Section 564(b)(1) of the Act, 21 U.S.C. section 360bbb-3(b)(1), unless the authorization is terminated or revoked.  Performed at [facility], [address] [place], [place] [address]   Blood Culture (routine x 2)     Status: None (Preliminary result)   Collection Time: 09/30/24 10:50 AM   Specimen: BLOOD  Result Value Ref Range Status   Specimen Description BLOOD RIGHT ANTECUBITAL  Final   Special Requests   Final    Blood Culture results may not be optimal due to an inadequate volume of blood received in culture bottles BOTTLES DRAWN AEROBIC AND ANAEROBIC   Culture   Final    NO GROWTH < 24 HOURS Performed at [facility], [address], [place], [place] [address]    Report Status PENDING  Incomplete  Urine Culture     Status:  Abnormal (Preliminary result)   Collection Time: 09/30/24 10:50 AM   Specimen: Urine, Random  Result Value Ref Range Status   Specimen Description   Final    URINE, RANDOM Performed at [facility], [address], [place], [place] [address]    Special Requests   Final    NONE Reflexed from [id number] Performed at [facility], [address], [place], [place] [address]    Culture (A)  Final    30,000 COLONIES/mL PSEUDOMONAS AERUGINOSA 60,000 COLONIES/mL YEAST SUSCEPTIBILITIES TO FOLLOW Performed at [facility], [address], [place], [place] [address]    Report Status PENDING  Incomplete  MRSA Next Gen by PCR, Nasal     Status: Abnormal   Collection Time: 09/30/24  6:35 PM   Specimen: Nasal Mucosa; Nasal Swab  Result Value Ref Range Status   MRSA by PCR Next Gen DETECTED (A) NOT DETECTED Final    Comment: RESULT CALLED TO, READ BACK BY AND VERIFIED WITH: STONE, S ON 09/30/24 AT 2239 BY LOY, C (NOTE) The GeneXpert MRSA  Assay (FDA approved for NASAL specimens only), is one component of a comprehensive MRSA colonization surveillance program. It is not intended to diagnose MRSA infection nor to guide or monitor treatment for MRSA infections. Test performance is not FDA approved in patients less than 56 years old. Performed at [facility], [address] [place] [address], [place], [place] [address]      Radiology Studies: [facility] Chest [facility] 1 View Result Date: 09/30/2024 EXAM: 1 VIEW XRAY OF THE CHEST 09/30/2024 09:58:48 AM COMPARISON: 05/29/2013. CLINICAL HISTORY: Query sepsis; evaluation for abnormality. FINDINGS: LUNGS AND PLEURA: No focal pulmonary opacity. No pleural effusion. No pneumothorax. HEART AND MEDIASTINUM: Left pacemaker interval placement. Progressive Aortic atherosclerosis. BONES AND SOFT TISSUES: No acute osseous abnormality. IMPRESSION: 1. No acute findings. Electronically signed by: Dayne Hassell MD 09/30/2024 12:55 PM EST RP Workstation: [id number]   CT Head Wo Contrast Result Date: 09/30/2024 EXAM: CT HEAD WITHOUT CONTRAST 09/30/2024 11:42:58 AM TECHNIQUE: CT of the head was performed without the administration of intravenous contrast. Automated exposure control, iterative reconstruction, and/or weight based adjustment of the mA/kV was utilized to reduce the radiation dose to as low as reasonably achievable. COMPARISON: CT head 12/12/2020. CLINICAL HISTORY: 89 year old male. Increasing confusion, headache, possible sepsis. FINDINGS: BRAIN AND VENTRICLES: No acute hemorrhage. No evidence of acute infarct. No hydrocephalus. No extra-axial collection. No mass effect or midline shift. Volume has not significantly changed, is within normal limits for age. No suspicious intracranial vascular hyperdensity. Differentiation stable and within normal limits for age. Calcified atherosclerosis at the skull base. ORBITS: No gaze deviation. SINUSES: Visible paranasal sinuses remain well aerated. SOFT TISSUES AND SKULL: Chronic right  mastoidectomy and cochlear implant appears stable from previous CT. Associated streak artifact. Contralateral chronic left side canal down mastoidectomy with opacified surgical cavity also appears stable. No acute soft tissue abnormality. No skull fracture. IMPRESSION: 1. No acute intracranial abnormality. 2. Right cochlear implant and chronic bilateral temporal bone surgical changes are stable. Electronically signed by: Helayne Hurst MD 09/30/2024 11:52 AM EST RP Workstation: [id number]    Scheduled Meds:  Chlorhexidine  Gluconate Cloth  6 each Topical Daily   feeding supplement  237 mL Oral BID BM   loratadine   10 mg Oral Daily   melatonin  6 mg Oral QHS   mupirocin  ointment  1 Application Nasal BID   tamsulosin   0.4 mg Oral QPC supper   Continuous Infusions:   LOS: 1 day   Norval  Cosette, MD  [facility]  10/01/2024, 2:19 PM   "

## 2024-10-01 NOTE — Plan of Care (Signed)

## 2024-10-02 LAB — CULTURE, BLOOD (ROUTINE X 2)
Culture: NO GROWTH
Culture: NO GROWTH
Special Requests: ADEQUATE

## 2024-10-02 LAB — URINE CULTURE: Culture: 30000 — AB

## 2024-10-02 NOTE — Progress Notes (Signed)
 " PROGRESS NOTE    Blake George  FMW:[id number] DOB: [birth year] DOA: 09/30/2024 PCP: Rosamond Leta NOVAK, MD  Subjective: No acute events overnight. Seen and examined at bedside with daughter present. Appears more awake and alert today. Unsure if he ate much yesterday. Unable to provide much history due to altered mentation. Able to answer simple questions and follow commands. Denies chest pain, palpitations, shortness of breath, nausea, vomiting, constipation.    Hospital Course:  89 y/o M with PMH of prolonged delirium since 07/2024 (no official dementia diagnosis as never recovered after December admission to undergo dementia workup), recurrent UTIs, acute urinary retention status post chronic indwelling foley catheter, CVA, CHB s/p PPM (07/28/24), HTN, HLD, T2DM, CKD3b, and bilateral cochlear implants who presents from [facility] for worsening confusion. Patient unable to provide any history due to altered mentation. History was obtained from patient's spouse and son-in-law at bedside. Collateral information was obtained from chart review.   Patient has been dealing with waxing and waning delirium for the past few months since he was admitted at [facility] in 07/2024. Patient at that time underwent PPM placement, and treatment for UTI. That hospital course was complicated by prolonged delirium despite treatment of reversible causes, malnutrition and possible failure to thrive. Patient ultimately was able to eat some home food and when his mentation got a little better, he was discharged to [facility]. Thereafter, patient was transitioned to [facility] with the understanding that he is to remain comfort care only patient. Patient was later transferred to [facility] where he most recently was started on antibiotic injections for UTI 6 days ago.    Patient was sent to the hospital from SNF due to worsening confusion that could not be managed at facility despite treatment with antibiotics.  Transitioned back to comfort care as per family wishes.    Assessment and Plan:  89 y/o M with PMH of prolonged delirium since 07/2024 (no official dementia diagnosis as never recovered after December admission to undergo dementia workup), recurrent UTIs, acute urinary retention status post chronic indwelling foley catheter, CVA, CHB s/p PPM (07/28/24), HTN, HLD, T2DM, CKD3b, and bilateral cochlear implants who presents from [facility] for worsening confusion.   Acute encephalopathy Waxing and waning course Etiology unclear, possibilities include persistent complicated UTI, prolonged delirium superimposed on undiagnosed dementia UA concerning for persistent UTI CT head unremarkable No overt metabolic derangements  Given 1L NS and ceftriaxone  in the ED Hold off on IV fluids and antibiotics  As per family, patient previously responded poorly to Ativan with worsening agitation Cont haldol  PRN for acute agitation Cont melatonin at bedtime for insomnia Cont trazodone  PRN for insomnia Encourage oral intake/hydration for comfort Monitor clinically   BPH Chronic indwelling foley catheter Given patient is comfort care only, will not exchange foley catheter and maintain for comfort     Chronic dysphagia As per wife, patient tolerates mechanical soft diet Modified diet ordered for comfort Encourage oral intake/hydration Monitor clinically  DVT prophylaxis:   None because of comfort care   Code Status: Do not attempt resuscitation (DNR) - Comfort care Family Communication: updated daughter at bedsid3 Disposition Plan: Hospice Reason for continuing need for hospitalization: medically ready, palliative recommendations, possible inpatient hospice evaluation, TOC helping with disposition   Objective: Vitals:   09/30/24 2140 10/01/24 0009 10/01/24 1617 10/01/24 2031  BP: (!) 120/56 (!) 106/41 109/61 (!) 131/59  Pulse: (!) 103 67 91 97  Resp: 18 18 19  20  Temp: 98.3 F (36.8 C) 97.9  F (36.6 C) (!) 97.1 F (36.2 C) 98.4 F (36.9 C)  TempSrc:  Oral Axillary Axillary  SpO2: 99% 97% 99% 99%  Weight:      Height:        Intake/Output Summary (Last 24 hours) at 10/02/2024 1428 Last data filed at 10/01/2024 1833 Gross per 24 hour  Intake --  Output 450 ml  Net -450 ml   Filed Weights   09/30/24 0922  Weight: 78 kg    Examination:  Physical Exam Vitals and nursing note reviewed.  Constitutional:      General: He is not in acute distress.    Appearance: He is ill-appearing.  HENT:     Head: Normocephalic and atraumatic.  Cardiovascular:     Rate and Rhythm: Normal rate and regular rhythm.     Pulses: Normal pulses.     Heart sounds: Normal heart sounds.  Pulmonary:     Effort: Pulmonary effort is normal.     Breath sounds: Normal breath sounds.  Abdominal:     General: Bowel sounds are normal.     Palpations: Abdomen is soft.  Neurological:     Mental Status: He is alert. He is disoriented.     Data Reviewed: I have personally reviewed following labs and imaging studies  CBC: Recent Labs  Lab 09/30/24 0955  WBC 9.4  HGB 10.9*  HCT 33.2*  MCV 102.5*  PLT 318   Basic Metabolic Panel: Recent Labs  Lab 09/30/24 0955  NA 145  K 4.2  CL 108  CO2 21*  GLUCOSE 163*  BUN 33*  CREATININE 1.45*  CALCIUM 9.0   GFR: Estimated Creatinine Clearance: 33.2 mL/min (A) (by C-G formula based on SCr of 1.45 mg/dL (H)). Liver Function Tests: Recent Labs  Lab 09/30/24 0955  AST 43*  ALT 34  ALKPHOS 243*  BILITOT 0.4  PROT 6.4*  ALBUMIN 3.2*   No results for input(s): LIPASE, AMYLASE in the last 168 hours. No results for input(s): AMMONIA in the last 168 hours. Coagulation Profile: Recent Labs  Lab 09/30/24 0955  INR 1.3*   Cardiac Enzymes: No results for input(s): CKTOTAL, CKMB, CKMBINDEX, TROPONINI in the last 168 hours. ProBNP, BNP (last 5 results) No results for input(s): PROBNP, BNP in the last 8760  hours. HbA1C: No results for input(s): HGBA1C in the last 72 hours. CBG: No results for input(s): GLUCAP in the last 168 hours. Lipid Profile: No results for input(s): CHOL, HDL, LDLCALC, TRIG, CHOLHDL, LDLDIRECT in the last 72 hours. Thyroid  Function Tests: No results for input(s): TSH, T4TOTAL, FREET4, T3FREE, THYROIDAB in the last 72 hours. Anemia Panel: No results for input(s): VITAMINB12, FOLATE, FERRITIN, TIBC, IRON, RETICCTPCT in the last 72 hours. Sepsis Labs: Recent Labs  Lab 09/30/24 0955 09/30/24 1119  LATICACIDVEN 1.7 1.3    Recent Results (from the past 240 hours)  Blood Culture (routine x 2)     Status: None (Preliminary result)   Collection Time: 09/30/24  9:55 AM   Specimen: BLOOD  Result Value Ref Range Status   Specimen Description BLOOD BLOOD RIGHT ARM  Final   Special Requests   Final    BOTTLES DRAWN AEROBIC AND ANAEROBIC Blood Culture adequate volume   Culture   Final    NO GROWTH 2 DAYS Performed at [facility], [address], [place], [place] [address]    Report Status PENDING  Incomplete  Resp panel by RT-PCR (RSV, Flu A&B, Covid) Urine,  Catheterized     Status: None   Collection Time: 09/30/24 10:50 AM   Specimen: Urine, Catheterized; Nasal Swab  Result Value Ref Range Status   SARS Coronavirus 2 by RT PCR NEGATIVE NEGATIVE Final    Comment: (NOTE) SARS-CoV-2 target nucleic acids are NOT DETECTED.  The SARS-CoV-2 RNA is generally detectable in upper respiratory specimens during the acute phase of infection. The lowest concentration of SARS-CoV-2 viral copies this assay can detect is 138 copies/mL. A negative result does not preclude SARS-Cov-2 infection and should not be used as the sole basis for treatment or other patient management decisions. A negative result may occur with  improper specimen collection/handling, submission of specimen other than nasopharyngeal swab, presence of viral mutation(s)  within the areas targeted by this assay, and inadequate number of viral copies(<138 copies/mL). A negative result must be combined with clinical observations, patient history, and epidemiological information. The expected result is Negative.  Fact Sheet for Patients:  bloggercourse.com  Fact Sheet for Healthcare Providers:  seriousbroker.it  This test is no t yet approved or cleared by the [place]  FDA and  has been authorized for detection and/or diagnosis of SARS-CoV-2 by FDA under an Emergency Use Authorization (EUA). This EUA will remain  in effect (meaning this test can be used) for the duration of the COVID-19 declaration under Section 564(b)(1) of the Act, 21 U.S.C.section 360bbb-3(b)(1), unless the authorization is terminated  or revoked sooner.       Influenza A by PCR NEGATIVE NEGATIVE Final   Influenza B by PCR NEGATIVE NEGATIVE Final    Comment: (NOTE) The Xpert Xpress SARS-CoV-2/FLU/RSV plus assay is intended as an aid in the diagnosis of influenza from Nasopharyngeal swab specimens and should not be used as a sole basis for treatment. Nasal washings and aspirates are unacceptable for Xpert Xpress SARS-CoV-2/FLU/RSV testing.  Fact Sheet for Patients: bloggercourse.com  Fact Sheet for Healthcare Providers: seriousbroker.it  This test is not yet approved or cleared by the [place]  FDA and has been authorized for detection and/or diagnosis of SARS-CoV-2 by FDA under an Emergency Use Authorization (EUA). This EUA will remain in effect (meaning this test can be used) for the duration of the COVID-19 declaration under Section 564(b)(1) of the Act, 21 U.S.C. section 360bbb-3(b)(1), unless the authorization is terminated or revoked.     Resp Syncytial Virus by PCR NEGATIVE NEGATIVE Final    Comment: (NOTE) Fact Sheet for  Patients: bloggercourse.com  Fact Sheet for Healthcare Providers: seriousbroker.it  This test is not yet approved or cleared by the [place]  FDA and has been authorized for detection and/or diagnosis of SARS-CoV-2 by FDA under an Emergency Use Authorization (EUA). This EUA will remain in effect (meaning this test can be used) for the duration of the COVID-19 declaration under Section 564(b)(1) of the Act, 21 U.S.C. section 360bbb-3(b)(1), unless the authorization is terminated or revoked.  Performed at [facility], [address] [place], [place] [address]   Blood Culture (routine x 2)     Status: None (Preliminary result)   Collection Time: 09/30/24 10:50 AM   Specimen: BLOOD  Result Value Ref Range Status   Specimen Description BLOOD RIGHT ANTECUBITAL  Final   Special Requests   Final    Blood Culture results may not be optimal due to an inadequate volume of blood received in culture bottles BOTTLES DRAWN AEROBIC AND ANAEROBIC   Culture   Final    NO GROWTH 2 DAYS Performed at [facility], [address], [place], [place] [address]    Report Status PENDING  Incomplete  Urine Culture     Status: Abnormal   Collection Time: 09/30/24 10:50 AM   Specimen: Urine, Random  Result Value Ref Range Status   Specimen Description   Final    URINE, RANDOM Performed at [facility], [address], [place], [place] [address]    Special Requests   Final    NONE Reflexed from [id number] Performed at [facility], [address] [place], [place] [address]    Culture (A)  Final    30,000 COLONIES/mL PSEUDOMONAS AERUGINOSA 60,000 COLONIES/mL YEAST    Report Status 10/02/2024 FINAL  Final   Organism ID, Bacteria PSEUDOMONAS AERUGINOSA (A)  Final      Susceptibility   Pseudomonas aeruginosa - MIC*    MEROPENEM <=0.25 SENSITIVE Sensitive     CIPROFLOXACIN 0.12 SENSITIVE Sensitive     IMIPENEM 2 SENSITIVE Sensitive     PIP/TAZO Value in  next row Sensitive      16 SENSITIVEThis is a modified FDA-approved test that has been validated and its performance characteristics determined by the reporting laboratory.  This laboratory is certified under the Clinical Laboratory Improvement Amendments CLIA as qualified to perform high complexity clinical laboratory testing.    CEFEPIME Value in next row Sensitive      16 SENSITIVEThis is a modified FDA-approved test that has been validated and its performance characteristics determined by the reporting laboratory.  This laboratory is certified under the Clinical Laboratory Improvement Amendments CLIA as qualified to perform high complexity clinical laboratory testing.    CEFTAZIDIME/AVIBACTAM Value in next row Sensitive      16 SENSITIVEThis is a modified FDA-approved test that has been validated and its performance characteristics determined by the reporting laboratory.  This laboratory is certified under the Clinical Laboratory Improvement Amendments CLIA as qualified to perform high complexity clinical laboratory testing.    CEFTOLOZANE/TAZOBACTAM Value in next row Sensitive      16 SENSITIVEThis is a modified FDA-approved test that has been validated and its performance characteristics determined by the reporting laboratory.  This laboratory is certified under the Clinical Laboratory Improvement Amendments CLIA as qualified to perform high complexity clinical laboratory testing.    TOBRAMYCIN Value in next row Sensitive      16 SENSITIVEThis is a modified FDA-approved test that has been validated and its performance characteristics determined by the reporting laboratory.  This laboratory is certified under the Clinical Laboratory Improvement Amendments CLIA as qualified to perform high complexity clinical laboratory testing.    CEFTAZIDIME Value in next row Sensitive      16 SENSITIVEThis is a modified FDA-approved test that has been validated and its performance characteristics determined by the  reporting laboratory.  This laboratory is certified under the Clinical Laboratory Improvement Amendments CLIA as qualified to perform high complexity clinical laboratory testing.    * 30,000 COLONIES/mL PSEUDOMONAS AERUGINOSA  MRSA Next Gen by PCR, Nasal     Status: Abnormal   Collection Time: 09/30/24  6:35 PM   Specimen: Nasal Mucosa; Nasal Swab  Result Value Ref Range Status   MRSA by PCR Next Gen DETECTED (A) NOT DETECTED Final    Comment: RESULT CALLED TO, READ BACK BY AND VERIFIED WITH: STONE, S ON 09/30/24 AT 2239 BY LOY, C (NOTE) The GeneXpert MRSA Assay (FDA approved for NASAL specimens only), is one component of a comprehensive MRSA colonization surveillance program. It is not intended to diagnose MRSA infection nor to  guide or monitor treatment for MRSA infections. Test performance is not FDA approved in patients less than 27 years old. Performed at [facility], [address], [place], [place] [address]      Radiology Studies: No results found.  Scheduled Meds:  Chlorhexidine  Gluconate Cloth  6 each Topical Daily   feeding supplement  237 mL Oral BID BM   loratadine   10 mg Oral Daily   melatonin  6 mg Oral QHS   mupirocin  ointment  1 Application Nasal BID   tamsulosin   0.4 mg Oral QPC supper   Continuous Infusions:   LOS: 2 days   Norval Bar, MD  [facility]  10/02/2024, 2:28 PM   "

## 2024-10-02 NOTE — Progress Notes (Signed)
 SPIRITUAL CARE AND COUNSELING CONSULT NOTE   VISIT SUMMARY Responded to consult from Palliative for patient/wife to receive spiritual care. Found patient surrounded by wife, daughter, and son in law today. At bedside, wife is tearful and shares how hard its been for her to watch her husband decline in the last few months. They've been married the majority of their life at 46 years married. This is a lifetime love and a hard transition. He has not lived at home in a few months and this has changed her entire world. She shared through tears their life and relationship with their church. They are both very devout Christians.  This clinical research associate provided space for wife to reflect and at the end when Mr. Howdeshell was able for him to share in prayer today. Will continue to provide spiritual support and to assess for spiritual need.   SPIRITUAL ENCOUNTER                                                                                                                                                                      Type of Visit: Initial Care provided to:: Pt and family Conversation partners present during encounter: Nurse Referral source: APP, IDT Rounds Reason for visit: End-of-life OnCall Visit: No   SPIRITUAL FRAMEWORK  Presenting Themes: Meaning/purpose/sources of inspiration, Impactful experiences and emotions, Courage hope and growth, Values and beliefs, Significant life change, Community and relationships Community/Connection: Family, Significant other, Faith community Patient Stress Factors: Loss of control, Loss, Health changes, Major life changes Family Stress Factors: Loss of control, Loss   GOALS       INTERVENTIONS   Spiritual Care Interventions Made: Established relationship of care and support, Compassionate presence, Reflective listening, Normalization of emotions, Narrative/life review, Meaning making, Bereavement/grief support, Prayer, Encouragement, Supported grief process     INTERVENTION OUTCOMES   Outcomes: Connection to spiritual care, Awareness of support, Reduced anxiety, Reduced fear, Autonomy/agency, Patient family open to resources, Awareness around self/spiritual resourses, Connection to values and goals of care  SPIRITUAL CARE PLAN   Spiritual Care Issues Still Outstanding: Chaplain will continue to follow, Referring to oncoming chaplain for further support    If immediate needs arise, please consult Zelda Salmon AMION schedule for chaplain coverage   Tanda PORTA [facility], Chaplain  10/02/2024 4:02 PM

## 2024-10-02 NOTE — Care Management Important Message (Signed)
 Important Message  Patient Details  Name: Blake George MRN: [id number] Date of Birth: [birth year]   Important Message Given:  Yes - Medicare IM     Ia Leeb L Mei Suits 10/02/2024, 3:39 PM

## 2024-10-02 NOTE — Plan of Care (Signed)
  Problem: Health Behavior/Discharge Planning: Goal: Ability to manage health-related needs will improve Outcome: Progressing   Problem: Elimination: Goal: Will not experience complications related to bowel motility Outcome: Progressing Goal: Will not experience complications related to urinary retention Outcome: Progressing   

## 2024-10-02 NOTE — TOC Progression Note (Signed)
 Transition of Care [facility]) - Progression Note    Patient Details  Name: Blake George MRN: [id number] Date of Birth: 22-Oct-1927  Transition of Care [facility]) CM/SW Contact  Ronnald MARLA Sil, RN Phone Number: 10/02/2024, 4:50 PM  Clinical Narrative:    Patient discussed during Progression rounds this morning with emphasis on patient's progress toward medical readiness to discharge today.  CM conducted chart review and confirmed discharge plan considerations for patient to transition to SNF for STR vs SNF with Hospice, and currently has No formal bed offers as of yet.   CM also made aware patient has been given Haldol  PRN for agitation, and SNF's require patients be free from physical or chemical restraints for 48-hrs before they will accept patients to their facilities.  CM met with Patient's wife at bedside to review placement options, acknowledging the complex situation of needing Hospice Care but not yet qualifying for Inpatient Hospice Facility, but also too debilitated to return home.     Patient's spouse recalling sub-optimal experience while at [facility] and adamant the patient would not be returning to that facility.   Spouse aware of SNF placement process, including Medicare Rehab benefit and Medicare.gov SNF ratings, and ambulance transportation to the facility.  Spouse indicated her SNF preferences as #1 - [facility] #2 - [facility], and #3 - [facility], and was reluctant to agreed for CM to disseminate referrals to additional facilities in the region citing her inability to drive long distances.  CM noted SNF referrals had already been disseminated and patient covered by traditional Medicare so No auth required, however, both Dtr - [facility] and Spouse acknowledged they are aware the patient used the 1st 21 - days of his Rehab benefit before re-admitting to the hospital.  Both verbalized understanding that the patient will be in his Co-Pay days for Rehab, but has BCBS SUPPLEMENT as secondary payor to help cover co-pay  costs.    CM team will continue to follow along and assist as appropriate in finalizing arrangements for patient's transition to SNF.   Expected Discharge Plan: Skilled Nursing Facility Barriers to Discharge: SNF Pending bed offer, Barriers Unresolved (comment) (Patient requiring PRN Haldol  for intermittent Agitation)  Expected Discharge Plan and Services In-house Referral: Hospice / Palliative Care Discharge Planning Services: CM Consult Living arrangements for the past 2 months: Skilled Nursing Facility         DME Arranged: N/A  Social Drivers of Health (SDOH) Interventions SDOH Screenings   Food Insecurity: No Food Insecurity (09/30/2024)  Housing: High Risk (09/30/2024)  Transportation Needs: Unmet Transportation Needs (09/30/2024)  Utilities: Not At Risk (09/30/2024)  Social Connections: Unknown (09/30/2024)  Stress: No Stress Concern Present (07/28/2024)   Received from [facility]  Tobacco Use: Medium Risk (09/30/2024)    Readmission Risk Interventions    10/01/2024    1:34 PM  Readmission Risk Prevention Plan  Transportation Screening Complete  Home Care Screening Complete  Medication Review (RN CM) Complete
